# Patient Record
Sex: Male | Born: 1985 | Race: Black or African American | Hispanic: No | Marital: Single | Smoking: Current every day smoker
Health system: Southern US, Community
[De-identification: ages and names within clinical notes are randomized; demographics above are authoritative.]

## PROBLEM LIST (undated history)

## (undated) DIAGNOSIS — J45909 Unspecified asthma, uncomplicated: Secondary | ICD-10-CM

## (undated) DIAGNOSIS — J40 Bronchitis, not specified as acute or chronic: Secondary | ICD-10-CM

---
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
## 2010-05-13 ENCOUNTER — Emergency Department (HOSPITAL_COMMUNITY): Payer: Self-pay

## 2010-05-13 ENCOUNTER — Emergency Department (HOSPITAL_COMMUNITY)
Admission: EM | Admit: 2010-05-13 | Discharge: 2010-05-13 | Disposition: A | Discharge status: Home / Self Care | Payer: Self-pay | Attending: Emergency Medicine | Admitting: Emergency Medicine

## 2010-05-13 DIAGNOSIS — R071 Chest pain on breathing: Secondary | ICD-10-CM | POA: Insufficient documentation

## 2010-05-13 DIAGNOSIS — R079 Chest pain, unspecified: Secondary | ICD-10-CM | POA: Insufficient documentation

## 2015-04-16 ENCOUNTER — Emergency Department (HOSPITAL_BASED_OUTPATIENT_CLINIC_OR_DEPARTMENT_OTHER): Payer: Self-pay

## 2015-04-16 ENCOUNTER — Emergency Department (HOSPITAL_BASED_OUTPATIENT_CLINIC_OR_DEPARTMENT_OTHER)
Admission: EM | Admit: 2015-04-16 | Discharge: 2015-04-16 | Disposition: A | Discharge status: Home / Self Care | Payer: Self-pay | Attending: Emergency Medicine | Admitting: Emergency Medicine

## 2015-04-16 ENCOUNTER — Encounter (HOSPITAL_BASED_OUTPATIENT_CLINIC_OR_DEPARTMENT_OTHER): Payer: Self-pay

## 2015-04-16 DIAGNOSIS — J3489 Other specified disorders of nose and nasal sinuses: Secondary | ICD-10-CM | POA: Insufficient documentation

## 2015-04-16 DIAGNOSIS — J209 Acute bronchitis, unspecified: Secondary | ICD-10-CM

## 2015-04-16 DIAGNOSIS — R0602 Shortness of breath: Secondary | ICD-10-CM | POA: Insufficient documentation

## 2015-04-16 DIAGNOSIS — Z720 Tobacco use: Secondary | ICD-10-CM

## 2015-04-16 DIAGNOSIS — F17200 Nicotine dependence, unspecified, uncomplicated: Secondary | ICD-10-CM | POA: Insufficient documentation

## 2015-04-16 LAB — CBC
HEMATOCRIT: 42.7 % (ref 39.0–52.0)
HEMOGLOBIN: 14.1 g/dL (ref 13.0–17.0)
MCH: 27.2 pg (ref 26.0–34.0)
MCHC: 33 g/dL (ref 30.0–36.0)
MCV: 82.3 fL (ref 78.0–100.0)
Platelets: 211 10*3/uL (ref 150–400)
RBC: 5.19 MIL/uL (ref 4.22–5.81)
RDW: 14 % (ref 11.5–15.5)
WBC: 4.4 10*3/uL (ref 4.0–10.5)

## 2015-04-16 LAB — BASIC METABOLIC PANEL
ANION GAP: 7 (ref 5–15)
BUN: 20 mg/dL (ref 6–20)
CALCIUM: 9.2 mg/dL (ref 8.9–10.3)
CO2: 27 mmol/L (ref 22–32)
Chloride: 106 mmol/L (ref 101–111)
Creatinine, Ser: 1.01 mg/dL (ref 0.61–1.24)
GFR calc Af Amer: >60 mL/min (ref >60)
GLUCOSE: 97 mg/dL (ref 65–99)
POTASSIUM: 4.5 mmol/L (ref 3.5–5.1)
SODIUM: 140 mmol/L (ref 135–145)

## 2015-04-16 LAB — TROPONIN I

## 2015-04-16 MED ORDER — PREDNISONE 20 MG PO TABS
40.0000 mg | ORAL_TABLET | Freq: Once | ORAL | Status: AC
  Administered 2015-04-16: 40 mg via ORAL
  Filled 2015-04-16: qty 2

## 2015-04-16 MED ORDER — ALBUTEROL SULFATE HFA 108 (90 BASE) MCG/ACT IN AERS
2.0000 | INHALATION_SPRAY | Freq: Once | RESPIRATORY_TRACT | Status: AC
  Administered 2015-04-16: 2 via RESPIRATORY_TRACT
  Filled 2015-04-16: qty 6.7

## 2015-04-16 MED ORDER — ALBUTEROL SULFATE (2.5 MG/3ML) 0.083% IN NEBU
5.0000 mg | INHALATION_SOLUTION | Freq: Once | RESPIRATORY_TRACT | Status: AC
  Administered 2015-04-16: 5 mg via RESPIRATORY_TRACT
  Filled 2015-04-16: qty 6

## 2015-04-16 MED ORDER — PREDNISONE 20 MG PO TABS: 40.0000 mg | ORAL_TABLET | Freq: Every day | ORAL | Status: DC

## 2015-04-16 MED FILL — predniSONE 20 MG TABS: 20 | 5 days supply | Qty: 10 | Fill #0

## 2015-04-16 NOTE — Discharge Instructions (Signed)
You can use the inhaler you were given in the emergency department as follows: Take 2 puffs every 4-6 hours for shortness of breath.  Do not hesitate to return to the emergency room for any new, worsening or concerning symptoms.  Please obtain primary care using resource guide below. Let them know that you were seen in the emergency room and that they will need to obtain records for further outpatient management.   Acute Bronchitis Bronchitis is inflammation of the airways that extend from the windpipe into the lungs (bronchi). The inflammation often causes mucus to develop. This leads to a cough, which is the most common symptom of bronchitis.  In acute bronchitis, the condition usually develops suddenly and goes away over time, usually in a couple weeks. Smoking, allergies, and asthma can make bronchitis worse. Repeated episodes of bronchitis may cause further lung problems.  CAUSES Acute bronchitis is most often caused by the same virus that causes a cold. The virus can spread from person to person (contagious) through coughing, sneezing, and touching contaminated objects. SIGNS AND SYMPTOMS   Cough.   Fever.   Coughing up mucus.   Body aches.   Chest congestion.   Chills.   Shortness of breath.   Sore throat.  DIAGNOSIS  Acute bronchitis is usually diagnosed through a physical exam. Your health care provider will also ask you questions about your medical history. Tests, such as chest X-rays, are sometimes done to rule out other conditions.  TREATMENT  Acute bronchitis usually goes away in a couple weeks. Oftentimes, no medical treatment is necessary. Medicines are sometimes given for relief of fever or cough. Antibiotic medicines are usually not needed but may be prescribed in certain situations. In some cases, an inhaler may be recommended to help reduce shortness of breath and control the cough. A cool mist vaporizer may also be used to help thin bronchial secretions and  make it easier to clear the chest.  HOME CARE INSTRUCTIONS  Get plenty of rest.   Drink enough fluids to keep your urine clear or pale yellow (unless you have a medical condition that requires fluid restriction). Increasing fluids may help thin your respiratory secretions (sputum) and reduce chest congestion, and it will prevent dehydration.   Take medicines only as directed by your health care provider.  If you were prescribed an antibiotic medicine, finish it all even if you start to feel better.  Avoid smoking and secondhand smoke. Exposure to cigarette smoke or irritating chemicals will make bronchitis worse. If you are a smoker, consider using nicotine gum or skin patches to help control withdrawal symptoms. Quitting smoking will help your lungs heal faster.   Reduce the chances of another bout of acute bronchitis by washing your hands frequently, avoiding people with cold symptoms, and trying not to touch your hands to your mouth, nose, or eyes.   Keep all follow-up visits as directed by your health care provider.  SEEK MEDICAL CARE IF: Your symptoms do not improve after 1 week of treatment.  SEEK IMMEDIATE MEDICAL CARE IF:  You develop an increased fever or chills.   You have chest pain.   You have severe shortness of breath.  You have bloody sputum.   You develop dehydration.  You faint or repeatedly feel like you are going to pass out.  You develop repeated vomiting.  You develop a severe headache. MAKE SURE YOU:   Understand these instructions.  Will watch your condition.  Will get help right away if you  are not doing well or get worse.   This information is not intended to replace advice given to you by your health care provider. Make sure you discuss any questions you have with your health care provider.   Document Released: 04/15/2004 Document Revised: 03/29/2014 Document Reviewed: 08/29/2012 Elsevier Interactive Patient Education 2016 [employer].  Smoking Cessation, Tips for Success If you are ready to quit smoking, congratulations! You have chosen to help yourself be healthier. Cigarettes bring nicotine, tar, carbon monoxide, and other irritants into your body. Your lungs, heart, and blood vessels will be able to work better without these poisons. There are many different ways to quit smoking. Nicotine gum, nicotine patches, a nicotine inhaler, or nicotine nasal spray can help with physical craving. Hypnosis, support groups, and medicines help break the habit of smoking. WHAT THINGS CAN I DO TO MAKE QUITTING EASIER?  Here are some tips to help you quit for good:  Pick a date when you will quit smoking completely. Tell all of your friends and family about your plan to quit on that date.  Do not try to slowly cut down on the number of cigarettes you are smoking. Pick a quit date and quit smoking completely starting on that day.  Throw away all cigarettes.   Clean and remove all ashtrays from your home, work, and car.  On a card, write down your reasons for quitting. Carry the card with you and read it when you get the urge to smoke.  Cleanse your body of nicotine. Drink enough water and fluids to keep your urine clear or pale yellow. Do this after quitting to flush the nicotine from your body.  Learn to predict your moods. Do not let a bad situation be your excuse to have a cigarette. Some situations in your life might tempt you into wanting a cigarette.  Never have "just one" cigarette. It leads to wanting another and another. Remind yourself of your decision to quit.  Change habits associated with smoking. If you smoked while driving or when feeling stressed, try other activities to replace smoking. Stand up when drinking your coffee. Brush your teeth after eating. Sit in a different chair when you read the paper. Avoid alcohol while trying to quit, and try to drink fewer caffeinated beverages. Alcohol and caffeine may urge you  to smoke.  Avoid foods and drinks that can trigger a desire to smoke, such as sugary or spicy foods and alcohol.  Ask people who smoke not to smoke around you.  Have something planned to do right after eating or having a cup of coffee. For example, plan to take a walk or exercise.  Try a relaxation exercise to calm you down and decrease your stress. Remember, you may be tense and nervous for the first 2 weeks after you quit, but this will pass.  Find new activities to keep your hands busy. Play with a pen, coin, or rubber band. Doodle or draw things on paper.  Brush your teeth right after eating. This will help cut down on the craving for the taste of tobacco after meals. You can also try mouthwash.   Use oral substitutes in place of cigarettes. Try using lemon drops, carrots, cinnamon sticks, or chewing gum. Keep them handy so they are available when you have the urge to smoke.  When you have the urge to smoke, try deep breathing.  Designate your home as a nonsmoking area.  If you are a heavy smoker, ask your health  care provider about a prescription for nicotine chewing gum. It can ease your withdrawal from nicotine.  Reward yourself. Set aside the cigarette money you save and buy yourself something nice.  Look for support from others. Join a support group or smoking cessation program. Ask someone at home or at work to help you with your plan to quit smoking.  Always ask yourself, "Do I need this cigarette or is this just a reflex?" Tell yourself, "Today, I choose not to smoke," or "I do not want to smoke." You are reminding yourself of your decision to quit.  Do not replace cigarette smoking with electronic cigarettes (commonly called e-cigarettes). The safety of e-cigarettes is unknown, and some may contain harmful chemicals.  If you relapse, do not give up! Plan ahead and think about what you will do the next time you get the urge to smoke. HOW WILL I FEEL WHEN I QUIT  SMOKING? You may have symptoms of withdrawal because your body is used to nicotine (the addictive substance in cigarettes). You may crave cigarettes, be irritable, feel very hungry, cough often, get headaches, or have difficulty concentrating. The withdrawal symptoms are only temporary. They are strongest when you first quit but will go away within 10-14 days. When withdrawal symptoms occur, stay in control. Think about your reasons for quitting. Remind yourself that these are signs that your body is healing and getting used to being without cigarettes. Remember that withdrawal symptoms are easier to treat than the major diseases that smoking can cause.  Even after the withdrawal is over, expect periodic urges to smoke. However, these cravings are generally short lived and will go away whether you smoke or not. Do not smoke! WHAT RESOURCES ARE AVAILABLE TO HELP ME QUIT SMOKING? Your health care provider can direct you to community resources or hospitals for support, which may include:  Group support.  Education.  Hypnosis.  Therapy.   This information is not intended to replace advice given to you by your health care provider. Make sure you discuss any questions you have with your health care provider.   Document Released: 12/05/2003 Document Revised: 03/29/2014 Document Reviewed: 08/24/2012 Elsevier Interactive Patient Education 2016 [employer].    Emergency Department Resource Guide 1) Find a Doctor and Pay Out of Pocket Although you won't have to find out who is covered by your insurance plan, it is a good idea to ask around and get recommendations. You will then need to call the office and see if the doctor you have chosen will accept you as a new patient and what types of options they offer for patients who are self-pay. Some doctors offer discounts or will set up payment plans for their patients who do not have insurance, but you will need to ask so you aren't surprised when you get  to your appointment.  2) Contact Your Local Health Department Not all health departments have doctors that can see patients for sick visits, but many do, so it is worth a call to see if yours does. If you don't know where your local health department is, you can check in your phone book. The CDC also has a tool to help you locate your state's health department, and many state websites also have listings of all of their local health departments.  3) Find a Walk-in Clinic If your illness is not likely to be very severe or complicated, you may want to try a walk in clinic. These are popping up all over the  country in pharmacies, drugstores, and shopping centers. They're usually staffed by nurse practitioners or physician assistants that have been trained to treat common illnesses and complaints. They're usually fairly quick and inexpensive. However, if you have serious medical issues or chronic medical problems, these are probably not your best option.  No Primary Care Doctor: - Call Health Connect at  [phone] - they can help you locate a primary care doctor that  accepts your insurance, provides certain services, etc. - Physician Referral Service- [phone]  Chronic Pain Problems: Organization         Address  Phone   Notes  [facility]  [phone] Patients need to be referred by their primary care doctor.   Medication Assistance: Organization         Address  Phone   Notes  [facility] Medication [facility] [address] [facility] [address] [place] [place], [place] [address] [phone] --Must be a resident of [facility] -- Must have NO insurance coverage whatsoever (no Medicaid/ Medicare, etc.) -- The pt. MUST have a primary care doctor that directs their care regularly and follows them in the community   [facility]  [phone]   [employer]  [phone]    Agencies that provide inexpensive medical care: Organization         Address  Phone    Notes  [facility]  [phone]   [facility]    [phone]   [facility] [address] [place], [place] [address] [phone]   [facility] [address], [place] [phone]   Planned Parenthood    [phone]   [facility]    [phone]   [facility]  [address], [place] Phone:  [phone], Fax:  [phone] Hours of Operation:  9 am - 6 pm, M-F.  Also accepts Medicaid/Medicare and self-pay.  [facility]  [address], [place] Phone: [phone], Fax: [phone]. Hours of Operation:  8:30 am - 5:30 pm, M-F.  Also accepts Medicaid and self-pay.  [facility] [place] [address] [place] Phone: [phone]   [facility] [address] Natasha Bence [place], [place] [phone], Ext. [phone] Mondays & Thursdays: 7-9 AM.  First 15 patients are seen on a first come, first serve basis.    Medicaid-accepting [facility] Providers:  Organization         Address  Phone   Notes  [facility] [address], [place] [phone] Also accepts self-pay patients.  [facility] [address] Laurell Josephs [place], [place]  [phone]   [facility] [address], [place] [phone]   [facility] [address], [place] [phone]   Renaye Rakers [address], [place]   [phone] Only accepts [place] Access [place] patients after they have their name applied to their card.   Self-Pay (no insurance) in [facility]:  Organization         Address  Phone   Notes  Sickle Cell Patients, [facility] [address] [place], [place] [phone]   [facility] [address] [place], [place] [phone]   [facility] [place]  [address]  [address], [place] [phone]   [facility]/Dr. Osei-Bonsu  [address], [place] or [address], [place] [phone] Phone number for both [place] and [place] locations is the same.  [facility] [address] [place] [phone]   [facility] [address], [place] or [address] [phone]   [facility] [address] [place] [phone], phone; [phone], fax Sees patients 1st and 3rd Saturday of every month.  Must not qualify for public or private insurance (i.e. Medicaid, Medicare, [place] Health Choice, Veterans' Benefits)  Household income should be no more than 200% of the poverty level The clinic cannot treat you if you are pregnant or think you are pregnant  Sexually transmitted diseases are not treated at the clinic.    Dental Care: Organization         Address  Phone  Notes  [facility] Department of [facility] [address] [place], [place] [phone] Accepts children up to age 46 who are enrolled in [place] or [place] Health Choice; pregnant women with a Medicaid card; and children who have applied for Medicaid or [place] Health Choice, but were declined, whose parents can pay a reduced fee at time of service.  [facility] Department of [facility]  [address], [place] [phone] Accepts children up to age 39 who are enrolled in [place] or [place] Health Choice; pregnant women with a Medicaid card; and children who have applied for Medicaid or [place] Health Choice, but were declined, whose parents can pay a reduced fee at time of service.  [facility]  [address] [place], [place] [phone] Patients are seen by appointment only. Walk-ins are not accepted. [facility] will see patients 81 years of age  and older. Monday - Tuesday (8am-5pm) Most Wednesdays (8:30-5pm) $30 per visit, cash only  [facility] Adult [facility] Access [facility]  [address], [place] [phone] Patients are seen by appointment only. Walk-ins are not accepted. [facility] will see patients 51 years of age and older. One Wednesday Evening (Monthly: Volunteer Based).  $30 per visit, cash only  [employer]  [phone] for adults; Children under age 2, call Graduate Pediatric Dentistry at [phone]. Children aged 82-14, please call [phone] to request a pediatric application.  Dental services are provided in all areas of dental care including fillings, crowns and bridges, complete and partial dentures, implants, gum treatment, root canals, and extractions. Preventive care is also provided. Treatment is provided to both adults and children. Patients are selected via a lottery and there is often a waiting list.   [facility] [address] [place]  [phone] [email or web address]   [facility] [address] [place], [place] [phone], Ext. [phone] Second and Fourth Thursday of each month, opens at 6:30 AM; Clinic ends at 9 AM.  Patients are seen on a first-come first-served basis, and a limited number are seen during each clinic.   [facility]  [address] Ether Griffins [place], [place] [phone]   Eligibility Requirements You must have lived in [place], [place], or [place] counties for at least the last three months.   You cannot be eligible for state or federal sponsored [place], including [employer], [place], or [employer].   You generally  cannot be eligible for healthcare insurance through your employer.    How to apply: Eligibility screenings are held every Tuesday and Wednesday afternoon from 1:00 pm until 4:00 pm. You do not need an appointment for the interview!  [facility] [address], [place], [place] [phone]   [facility] Health Department  [phone]   [facility] Health Department  [phone]   [facility] Health Department  [phone]    Behavioral Health Resources in the Community: Intensive Outpatient Programs Organization         Address  Phone  Notes  [facility] Services [address] [place], [place] [phone]   [facility] Outpatient [address] [place], [place] [phone]   [facility] [address] [place], [place]  [phone]   [facility] [address],  [place], [place] [phone] or [phone]   Substance Abuse Resources Organization         Address  Phone  Notes  [facility]  [phone]   [facility]  [phone]   [employer]  [phone]   Floydene Flock  [phone]   Residential & Outpatient Substance Abuse Program  [phone]   Psychological Services Organization         Address  Phone  Notes  [facility] Behavioral Health  [phone]   [facility]  [phone]   [facility] [address], [place] [phone] or [phone]    Mobile Crisis Teams Organization         Address  Phone  Notes  [facility], Mobile Crisis Care Unit  [phone]   [facility]  [address] [place], [place] [phone]   Doristine Locks [address] [place] [phone]    Self-Help/Support Groups Organization         Address  Phone             Notes  [facility] [place] - variety of support groups  [phone]- [phone] Call for more information  Narcotics Anonymous (NA), [facility] [address], [employer] [place]  2 meetings at this location   Statistician         Address  Phone  Notes  [facility] [address],    [place]  [phone]   [facility]  [address], [place] [address], [place], [place] [phone]   [facility] Treatment Facility [address] [place], [place] [phone] Admissions: 8am-3pm M-F  [facility] [address],    [place], [place] [phone]   [facility] [address] [place], [place], [place] [phone]   [facility] [address]  [place], [place] [phone]   [facility] - Intensive Outpatient [address] Laurell Josephs [address], [place], [place] [phone]   [facility] ([facility]iction [facility] [address] [place].,  [place], [place] [phone] or [phone]   [facility] ([facility]) [address], [place], [place] [phone] Accepts Medicaid  [facility] [place] [address]  [place] [phone] Substance Abuse/Addiction Treatment   [facility] Organization         Address  Phone  Notes  [facility]  [phone]   Angie Fava, PhD [address] [place], [place]   [phone] or [phone]   [facility]   [address]  [address] [place], [place] [phone]) [phone]   [facility] [address], [place], [place] [phone]) [phone] Insurance/Medicaid/sponsorship through [facility]  [facility] [address]                                    [place], [place] [phone](336) [phone] Therapy/tele-psych/case  [facility] [address]   [place], [place] [phone]) [phone]    Dr. Lolly MustacheArfeen  [phone]) [phone]   [facility] [place]  [employer] [facility] 1) [address], [place] 2) [address], [place] 3)  [address], [place] [phone]) [phone]) [phone]  [phone](336) [phone]   [facility] Child Abuse Hotline [phone]) [phone] or [phone](336) [phone] (After Hours)

## 2015-04-16 NOTE — ED Provider Notes (Signed)
CSN: [id number]     Arrival date & time 04/16/15  1138 History   First MD Initiated Contact with Patient 04/16/15 1205     Chief Complaint  Patient presents with  . Chest Pain     (Consider location/radiation/quality/duration/timing/severity/associated sxs/prior Treatment) HPI   Blood pressure 150/93, pulse 71, temperature 98.4 F (36.9 C), temperature source Oral, resp. rate 16, height  (1.702 m), weight 63.504 kg, SpO2 100 %.  Trevor Gonzalez is a 30 y.o. male complaining of diffuse chest tightness with associated shortness of breath and rhinorrhea onset 4 days ago no medication taken prior to arrival. Patient denies actual pain, cough, fever, chills, palpitations, lightheadedness, family history of early cardiac death, cocaine or methamphetamine use, diabetes, hypertension, hyperlipidemia, history of DVT or PE, recent mobilizations, calf pain or leg swelling, cancer, chemotherapy. On review of systems, patient endorses daily tobacco use.  History reviewed. No pertinent past medical history. History reviewed. No pertinent past surgical history. No family history on file. Social History  Substance Use Topics  . Smoking status: Current Every Day Smoker  . Smokeless tobacco: None  . Alcohol Use: Yes     Comment: occ    Review of Systems  10 systems reviewed and found to be negative, except as noted in the HPI.  Allergies  Review of patient's allergies indicates no known allergies.  Home Medications   Prior to Admission medications   Medication Sig Start Date End Date Taking? Authorizing Provider  predniSONE (DELTASONE) 20 MG tablet Take 2 tablets (40 mg total) by mouth daily. 04/16/15   Dante Cooter, PA-C   BP 125/75 mmHg  Pulse 68  Temp(Src) 98.4 F (36.9 C) (Oral)  Resp 19  Ht  (1.702 m)  Wt 63.504 kg  BMI 21.92 kg/m2  SpO2 96% Physical Exam  Constitutional: He is oriented to person, place, and time. He appears well-developed and well-nourished. No  distress.  HENT:  Head: Normocephalic.  Mouth/Throat: Oropharynx is clear and moist.  Eyes: Conjunctivae are normal.  Neck: Normal range of motion. No JVD present. No tracheal deviation present.  Cardiovascular: Normal rate, regular rhythm and intact distal pulses.   Radial pulse equal bilaterally  Pulmonary/Chest: Effort normal and breath sounds normal. No stridor. No respiratory distress. He has no wheezes. He has no rales. He exhibits no tenderness.  Abdominal: Soft. He exhibits no distension and no mass. There is no tenderness. There is no rebound and no guarding.  Musculoskeletal: Normal range of motion. He exhibits no edema or tenderness.  No calf asymmetry, superficial collaterals, palpable cords, edema, Homans sign negative bilaterally.    Neurological: He is alert and oriented to person, place, and time.  Skin: Skin is warm. He is not diaphoretic.  Psychiatric: He has a normal mood and affect.  Nursing note and vitals reviewed.   ED Course  Procedures (including critical care time) Labs Review Labs Reviewed  BASIC METABOLIC PANEL  CBC  TROPONIN I    Imaging Review Dg Chest 2 View  04/16/2015  CLINICAL DATA:  Shortness of breath, left-sided chest tightness for 4 days EXAM: CHEST  2 VIEW COMPARISON:  05/13/2010 FINDINGS: The heart size and mediastinal contours are within normal limits. Both lungs are clear. The visualized skeletal structures are unremarkable. IMPRESSION: No active cardiopulmonary disease. Electronically Signed   By: Elige Ko   On: 04/16/2015 12:46   I have personally reviewed and evaluated these images and lab results as part of my medical decision-making.  EKG Interpretation   Date/Time:  Wednesday April 16 2015 11:47:55 EST Ventricular Rate:  69 PR Interval:  128 QRS Duration: 84 QT Interval:  389 QTC Calculation: 417 R Axis:   60 Text Interpretation:  Sinus rhythm ST elev, probable normal early repol  pattern Baseline wander in lead(s) I  II III aVR aVF No previous tracing  Confirmed by Anitra Lauth  MD, WHITNEY ([id number]) on 04/16/2015 11:59:47 AM      MDM   Final diagnoses:  Acute bronchitis, unspecified organism  Tobacco use    Filed Vitals:   04/16/15 1230 04/16/15 1243 04/16/15 1245 04/16/15 1300  BP: 140/88   125/75  Pulse: 66  66 68  Temp:      TempSrc:      Resp: Height:      Weight:      SpO2: 100% 100% 100% 96%    Medications  predniSONE (DELTASONE) tablet 40 mg (40 mg Oral Given 04/16/15 1232)  albuterol (PROVENTIL) (2.5 MG/3ML) 0.083% nebulizer solution 5 mg (5 mg Nebulization Given 04/16/15 1242)  albuterol (PROVENTIL HFA;VENTOLIN HFA) 108 (90 Base) MCG/ACT inhaler 2 puff (2 puffs Inhalation Given 04/16/15 1242)    Trevor Gonzalez is 30 y.o. male presenting with chest tightness, shortness of breath and rhinorrhea. EKG nonischemic, lung sounds clear to auscultation bilaterally, patient saturating well on room air, there is no tachypnea or tachycardia. His blood pressure is mildly elevated. I've counseled this patient for greater than 10 minutes about smoking cessation. Will initiate generalized cardiac workup, likely a acute bronchitis. Patient will be given prednisone and nebulizer treatment.  Chest x-ray clear, blood work without abnormality. Low risk by HEART store and PERC negative. patient with improvement after nebulizer, again stressed importance of smoking cessation.   Evaluation does not show pathology that would require ongoing emergent intervention or inpatient treatment. Pt is hemodynamically stable and mentating appropriately. Discussed findings and plan with patient/guardian, who agrees with care plan. All questions answered. Return precautions discussed and outpatient follow up given.   New Prescriptions   PREDNISONE (DELTASONE) 20 MG TABLET    Take 2 tablets (40 mg total) by mouth daily.         Wynetta Emery, PA-C 04/16/15 1309  Gwyneth Sprout, MD 04/16/15 1432

## 2015-04-16 NOTE — ED Notes (Signed)
Patient transported to X-ray 

## 2015-04-16 NOTE — ED Notes (Signed)
CP x 3-4 days-NAD-steady gait

## 2015-06-18 ENCOUNTER — Emergency Department (HOSPITAL_BASED_OUTPATIENT_CLINIC_OR_DEPARTMENT_OTHER): Payer: Worker's Compensation

## 2015-06-18 ENCOUNTER — Encounter (HOSPITAL_BASED_OUTPATIENT_CLINIC_OR_DEPARTMENT_OTHER): Payer: Self-pay | Admitting: Emergency Medicine

## 2015-06-18 DIAGNOSIS — S61012A Laceration without foreign body of left thumb without damage to nail, initial encounter: Secondary | ICD-10-CM | POA: Diagnosis not present

## 2015-06-18 DIAGNOSIS — F17200 Nicotine dependence, unspecified, uncomplicated: Secondary | ICD-10-CM | POA: Diagnosis not present

## 2015-06-18 DIAGNOSIS — W231XXA Caught, crushed, jammed, or pinched between stationary objects, initial encounter: Secondary | ICD-10-CM | POA: Insufficient documentation

## 2015-06-18 DIAGNOSIS — Y9289 Other specified places as the place of occurrence of the external cause: Secondary | ICD-10-CM | POA: Diagnosis not present

## 2015-06-18 DIAGNOSIS — Z7952 Long term (current) use of systemic steroids: Secondary | ICD-10-CM | POA: Insufficient documentation

## 2015-06-18 DIAGNOSIS — Y990 Civilian activity done for income or pay: Secondary | ICD-10-CM | POA: Insufficient documentation

## 2015-06-18 DIAGNOSIS — Y9389 Activity, other specified: Secondary | ICD-10-CM | POA: Diagnosis not present

## 2015-06-18 DIAGNOSIS — S6992XA Unspecified injury of left wrist, hand and finger(s), initial encounter: Secondary | ICD-10-CM | POA: Diagnosis present

## 2015-06-18 NOTE — ED Notes (Signed)
Patient states that he hit his left thumb at work.

## 2015-06-19 ENCOUNTER — Emergency Department (HOSPITAL_BASED_OUTPATIENT_CLINIC_OR_DEPARTMENT_OTHER)
Admission: EM | Admit: 2015-06-19 | Discharge: 2015-06-19 | Disposition: A | Discharge status: Home / Self Care | Payer: Worker's Compensation | Attending: Emergency Medicine | Admitting: Emergency Medicine

## 2015-06-19 DIAGNOSIS — IMO0002 Reserved for concepts with insufficient information to code with codable children: Secondary | ICD-10-CM

## 2015-06-19 MED ORDER — LIDOCAINE HCL (PF) 2 % IJ SOLN
INTRAMUSCULAR | Status: AC
  Filled 2015-06-19: qty 4

## 2015-06-19 MED ORDER — BACITRACIN-NEOMYCIN-POLYMYXIN OINTMENT TUBE
TOPICAL_OINTMENT | Freq: Once | CUTANEOUS | Status: AC
  Administered 2015-06-19: 02:00:00 via TOPICAL
  Filled 2015-06-19: qty 15

## 2015-06-19 MED ORDER — LIDOCAINE HCL 2 % IJ SOLN: 10.0000 mL | Freq: Once | INTRAMUSCULAR | Status: DC

## 2015-06-19 NOTE — Discharge Instructions (Signed)
Return to ER, going to urgent care, or see your doctor in 10 days for suture removal  Laceration Care, Adult A laceration is a cut that goes through all of the layers of the skin and into the tissue that is right under the skin. Some lacerations heal on their own. Others need to be closed with stitches (sutures), staples, skin adhesive strips, or skin glue. Proper laceration care minimizes the risk of infection and helps the laceration to heal better. HOW TO CARE FOR YOUR LACERATION If sutures or staples were used:  Keep the wound clean and dry.  If you were given a bandage (dressing), you should change it at least one time per day or as told by your health care provider. You should also change it if it becomes wet or dirty.  Keep the wound completely dry for the first 24 hours or as told by your health care provider. After that time, you may shower or bathe. However, make sure that the wound is not soaked in water until after the sutures or staples have been removed.  Clean the wound one time each day or as told by your health care provider:  Wash the wound with soap and water.  Rinse the wound with water to remove all soap.  Pat the wound dry with a clean towel. Do not rub the wound.  After cleaning the wound, apply a thin layer of antibiotic ointmentas told by your health care provider. This will help to prevent infection and keep the dressing from sticking to the wound.  Have the sutures or staples removed as told by your health care provider. If skin adhesive strips were used:  Keep the wound clean and dry.  If you were given a bandage (dressing), you should change it at least one time per day or as told by your health care provider. You should also change it if it becomes dirty or wet.  Do not get the skin adhesive strips wet. You may shower or bathe, but be careful to keep the wound dry.  If the wound gets wet, pat it dry with a clean towel. Do not rub the wound.  Skin  adhesive strips fall off on their own. You may trim the strips as the wound heals. Do not remove skin adhesive strips that are still stuck to the wound. They will fall off in time. If skin glue was used:  Try to keep the wound dry, but you may briefly wet it in the shower or bath. Do not soak the wound in water, such as by swimming.  After you have showered or bathed, gently pat the wound dry with a clean towel. Do not rub the wound.  Do not do any activities that will make you sweat heavily until the skin glue has fallen off on its own.  Do not apply liquid, cream, or ointment medicine to the wound while the skin glue is in place. Using those may loosen the film before the wound has healed.  If you were given a bandage (dressing), you should change it at least one time per day or as told by your health care provider. You should also change it if it becomes dirty or wet.  If a dressing is placed over the wound, be careful not to apply tape directly over the skin glue. Doing that may cause the glue to be pulled off before the wound has healed.  Do not pick at the glue. The skin glue usually remains in  place for 5-10 days, then it falls off of the skin. General Instructions  Take over-the-counter and prescription medicines only as told by your health care provider.  If you were prescribed an antibiotic medicine or ointment, take or apply it as told by your doctor. Do not stop using it even if your condition improves.  To help prevent scarring, make sure to cover your wound with sunscreen whenever you are outside after stitches are removed, after adhesive strips are removed, or when glue remains in place and the wound is healed. Make sure to wear a sunscreen of at least 30 SPF.  Do not scratch or pick at the wound.  Keep all follow-up visits as told by your health care provider. This is important.  Check your wound every day for signs of infection. Watch for:  Redness, swelling, or  pain.  Fluid, blood, or pus.  Raise (elevate) the injured area above the level of your heart while you are sitting or lying down, if possible. SEEK MEDICAL CARE IF:  You received a tetanus shot and you have swelling, severe pain, redness, or bleeding at the injection site.  You have a fever.  A wound that was closed breaks open.  You notice a bad smell coming from your wound or your dressing.  You notice something coming out of the wound, such as wood or glass.  Your pain is not controlled with medicine.  You have increased redness, swelling, or pain at the site of your wound.  You have fluid, blood, or pus coming from your wound.  You notice a change in the color of your skin near your wound.  You need to change the dressing frequently due to fluid, blood, or pus draining from the wound.  You develop a new rash.  You develop numbness around the wound. SEEK IMMEDIATE MEDICAL CARE IF:  You develop severe swelling around the wound.  Your pain suddenly increases and is severe.  You develop painful lumps near the wound or on skin that is anywhere on your body.  You have a red streak going away from your wound.  The wound is on your hand or foot and you cannot properly move a finger or toe.  The wound is on your hand or foot and you notice that your fingers or toes look pale or bluish.   This information is not intended to replace advice given to you by your health care provider. Make sure you discuss any questions you have with your health care provider.   Document Released: 03/08/2005 Document Revised: 07/23/2014 Document Reviewed: 03/04/2014 Elsevier Interactive Patient Education [employer]2016 [employer]

## 2015-06-19 NOTE — ED Provider Notes (Signed)
CSN: [id number]     Arrival date & time 06/18/15  2253 History   First MD Initiated Contact with Patient 06/19/15 [phone]     Chief Complaint  Patient presents with  . Finger Injury     (Consider location/radiation/quality/duration/timing/severity/associated sxs/prior Treatment) HPI Comments: Injury to left thumb while at work several hours ago. Complains of laceration an mild pain over knuckle.    History reviewed. No pertinent past medical history. History reviewed. No pertinent past surgical history. History reviewed. No pertinent family history. Social History  Substance Use Topics  . Smoking status: Current Every Day Smoker  . Smokeless tobacco: None  . Alcohol Use: Yes     Comment: occ    Review of Systems  Skin: Positive for wound.      Allergies  Review of patient's allergies indicates no known allergies.  Home Medications   Prior to Admission medications   Medication Sig Start Date End Date Taking? Authorizing Provider  predniSONE (DELTASONE) 20 MG tablet Take 2 tablets (40 mg total) by mouth daily. 04/16/15   Nicole Pisciotta, PA-C   BP 153/91 mmHg  Pulse 63  Temp(Src) 98.2 F (36.8 C) (Oral)  Resp 18  Ht 5\' 9"  (1.753 m)  Wt 140 lb (63.504 kg)  BMI 20.67 kg/m2  SpO2 100% Physical Exam  Musculoskeletal: Normal range of motion.       Left hand: He exhibits laceration. He exhibits normal range of motion, normal two-point discrimination, normal capillary refill and no deformity. Normal sensation noted. Decreased strength noted. He exhibits finger abduction and thumb/finger opposition.       Hands: Neurological: He has normal strength. No sensory deficit.  Skin: Laceration noted.    ED Course  Procedures (including critical care time)  LACERATION REPAIR Performed by: Gilda CreasePOLLINA, CHRISTOPHER J. Authorized by: Gilda CreasePOLLINA, CHRISTOPHER J. Consent: Verbal consent obtained. Risks and benefits: risks, benefits and alternatives were discussed Consent given by:  patient Patient identity confirmed: provided demographic data Prepped and Draped in normal sterile fashion Wound explored  Laceration Location: thumb  Laceration Length: 1cm  No Foreign Bodies seen or palpated  Anesthesia: digital block  Local anesthetic: lidocaine 2% without epinephrine  Anesthetic total: 6 ml  Irrigation method: syringe Amount of cleaning: standard  Skin closure: suture  Number of sutures: 2  Technique: simple interrupted, 4-0 prolene  Patient tolerance: Patient tolerated the procedure well with no immediate complications.   Labs Review Labs Reviewed - No data to display  Imaging Review Dg Hand Complete Left  06/19/2015  CLINICAL DATA:  30 year old male with left thumb injury. EXAM: LEFT HAND - COMPLETE 3+ VIEW COMPARISON:  None. FINDINGS: There is no evidence of fracture or dislocation. There is no evidence of arthropathy or other focal bone abnormality. Soft tissues are unremarkable. IMPRESSION: Negative. Electronically Signed   By: Elgie CollardArash  Radparvar M.D.   On: 06/19/2015 00:27   I have personally reviewed and evaluated these images and lab results as part of my medical decision-making.   EKG Interpretation None      MDM   Final diagnoses:  None  Laceration  Patient presents to the emergency part for evaluation of laceration of left thumb. Patient reports that he cut his thumb caught between 2 pieces of machinery causing the cough. X-ray does not show any evidence of fracture or foreign body. Wound was sutured, suture removal in 10 days.    Gilda Creasehristopher J Pollina, MD 06/19/15 [id number]

## 2015-06-19 NOTE — ED Notes (Signed)
Wound care done

## 2015-06-19 NOTE — ED Notes (Signed)
Pt states he got his left thumb caught between two pieces of machinery. Approx 1/2 cm lac noted to left thumb. Bleeding controlled. Moves thumb. Feels touch. Cap refill < 3 sec.

## 2015-06-19 NOTE — ED Notes (Signed)
Pt given d/c instructions. Verbalizes understanding. No questions. 

## 2015-07-25 ENCOUNTER — Emergency Department (HOSPITAL_BASED_OUTPATIENT_CLINIC_OR_DEPARTMENT_OTHER)
Admission: EM | Admit: 2015-07-25 | Discharge: 2015-07-25 | Disposition: A | Discharge status: Home / Self Care | Payer: Self-pay | Attending: Emergency Medicine | Admitting: Emergency Medicine

## 2015-07-25 ENCOUNTER — Encounter (HOSPITAL_BASED_OUTPATIENT_CLINIC_OR_DEPARTMENT_OTHER): Payer: Self-pay | Admitting: *Deleted

## 2015-07-25 DIAGNOSIS — F17200 Nicotine dependence, unspecified, uncomplicated: Secondary | ICD-10-CM | POA: Insufficient documentation

## 2015-07-25 DIAGNOSIS — Z5189 Encounter for other specified aftercare: Secondary | ICD-10-CM

## 2015-07-25 DIAGNOSIS — Z4800 Encounter for change or removal of nonsurgical wound dressing: Secondary | ICD-10-CM | POA: Insufficient documentation

## 2015-07-25 NOTE — Discharge Instructions (Signed)
Your wound appears to be healing well this time. Watch for redness or creasing swelling. Watch for increased pain.

## 2015-07-25 NOTE — ED Provider Notes (Signed)
CSN: [id number]     Arrival date & time 07/25/15  1752 History  By signing my name below, I, Linus GalasMaharshi Patel, attest that this documentation has been prepared under the direction and in the presence of Benjiman CoreNathan Nana Vastine, MD. Electronically Signed: Linus GalasMaharshi Patel, ED Scribe. 07/25/2015. 6:08 PM.   Chief Complaint  Patient presents with  . Wound Check   The history is provided by the patient. No language interpreter was used.    HPI Comments: Trevor Gonzalez is a 30 y.o. male who presents to the Emergency Department with no PMHx complaining of for a wound check today. Pt states one month ago, he slammed his right thumb between a table and a machine. He had sutures taken out of his left thumb last week and notes swelling since. Pt denies any fevers or chills. Pt has no other complaint at this time.    History reviewed. No pertinent past medical history. History reviewed. No pertinent past surgical history. No family history on file. Social History  Substance Use Topics  . Smoking status: Current Every Day Smoker  . Smokeless tobacco: None  . Alcohol Use: Yes     Comment: occ    Review of Systems  Constitutional: Negative for fever and chills.  Skin: Positive for wound.   Allergies  Review of patient's allergies indicates no known allergies.  Home Medications   Prior to Admission medications   Medication Sig Start Date End Date Taking? Authorizing Provider  predniSONE (DELTASONE) 20 MG tablet Take 2 tablets (40 mg total) by mouth daily. 04/16/15   Nicole Pisciotta, PA-C   BP 145/100 mmHg  Pulse 70  Temp(Src) 98.2 F (36.8 C) (Oral)  Resp 16  Ht 5\' 7"  (1.702 m)  Wt 140 lb (63.504 kg)  BMI 21.92 kg/m2  SpO2 99%   Physical Exam  Constitutional: He is oriented to person, place, and time. He appears well-developed and well-nourished.  HENT:  Head: Normocephalic and atraumatic.  Cardiovascular: Normal rate.   Pulmonary/Chest: Effort normal.  Musculoskeletal:  Lateral aspect of  the left thumb, over the IP joint is a 1.5 cm darkened raised area with 4 smaller ulcers to the suture site, good flexion and extension, strength and sensation 5/5, no proximal lymphadenopathy, no fluctuance or erythema.    Neurological: He is alert and oriented to person, place, and time.  Skin: Skin is warm and dry.  Psychiatric: He has a normal mood and affect.  Nursing note and vitals reviewed.  ED Course  Procedures  DIAGNOSTIC STUDIES: Oxygen Saturation is 99% on room air, normal by my interpretation.    COORDINATION OF CARE: 6:02 PM Discussed treatment plan with pt at bedside and pt agreed to plan.  MDM   Final diagnoses:  Visit for wound check    Patient for recheck of wound on thumb. Removed after being in for rather long time. Wound does not appear infected this time. Good range of motion. Patient will do some warm soaks. Will discharge.  I personally performed the services described in this documentation, which was scribed in my presence. The recorded information has been reviewed and is accurate.       Benjiman CoreNathan Kaeden Depaz, MD 07/25/15 [phone]

## 2015-07-25 NOTE — ED Notes (Signed)
States he had sutures taken out of his left thumb last week and now he has swelling of his finger.

## 2017-06-13 ENCOUNTER — Other Ambulatory Visit: Payer: Self-pay

## 2017-06-13 ENCOUNTER — Emergency Department (HOSPITAL_BASED_OUTPATIENT_CLINIC_OR_DEPARTMENT_OTHER)
Admission: EM | Admit: 2017-06-13 | Discharge: 2017-06-13 | Disposition: A | Discharge status: Home / Self Care | Payer: Self-pay | Attending: Emergency Medicine | Admitting: Emergency Medicine

## 2017-06-13 ENCOUNTER — Encounter (HOSPITAL_BASED_OUTPATIENT_CLINIC_OR_DEPARTMENT_OTHER): Payer: Self-pay | Admitting: *Deleted

## 2017-06-13 DIAGNOSIS — Z202 Contact with and (suspected) exposure to infections with a predominantly sexual mode of transmission: Secondary | ICD-10-CM | POA: Insufficient documentation

## 2017-06-13 DIAGNOSIS — L02415 Cutaneous abscess of right lower limb: Secondary | ICD-10-CM | POA: Insufficient documentation

## 2017-06-13 DIAGNOSIS — L0291 Cutaneous abscess, unspecified: Secondary | ICD-10-CM

## 2017-06-13 DIAGNOSIS — N509 Disorder of male genital organs, unspecified: Secondary | ICD-10-CM | POA: Insufficient documentation

## 2017-06-13 LAB — URINALYSIS, ROUTINE W REFLEX MICROSCOPIC
Bilirubin Urine: NEGATIVE
Glucose, UA: NEGATIVE mg/dL
Hgb urine dipstick: NEGATIVE
KETONES UR: NEGATIVE mg/dL
LEUKOCYTES UA: NEGATIVE
NITRITE: NEGATIVE
PH: 7.5 (ref 5.0–8.0)
Protein, ur: NEGATIVE mg/dL
Specific Gravity, Urine: 1.01 (ref 1.005–1.030)

## 2017-06-13 MED ORDER — DOXYCYCLINE HYCLATE 100 MG PO CAPS: 100.0000 mg | ORAL_CAPSULE | Freq: Two times a day (BID) | ORAL | 0 refills | Status: DC

## 2017-06-13 MED ORDER — LIDOCAINE-EPINEPHRINE (PF) 2 %-1:200000 IJ SOLN
10.0000 mL | Freq: Once | INTRAMUSCULAR | Status: AC
  Administered 2017-06-13: 10 mL
  Filled 2017-06-13: qty 10

## 2017-06-13 MED FILL — DOXYCYCLINE HYCLATE 100 MG: 100 | 7 days supply | Qty: 14 | Fill #0

## 2017-06-13 NOTE — ED Triage Notes (Signed)
Penile discharge. Possible STD exposure. He also wants to have his right thigh examined. He was bit by a spider 3 days ago.

## 2017-06-13 NOTE — Discharge Instructions (Addendum)
He was seen in the emergency department today and found to have an abscess.  This was incised and drained.  A packing was placed in the abscess.  This may continue to drain over the next 24-48 hours.  Also prescribed you doxycycline, this is an antibiotic to take to help treat with the infection.  Take this once in the morning and once in the evening.  Take Motrin and/or Tylenol per over-the-counter dosing instructions for pain. Please take all of your antibiotics until finished. You may develop abdominal discomfort or diarrhea from the antibiotic.  You may help offset this with probiotics which you can buy at the store (ask your pharmacist if unable to find) or get probiotics in the form of eating yogurt. Do not eat or take the probiotics until 2 hours after your antibiotic. If you are unable to tolerate these side effects follow-up with your primary care provider or return to the emergency department.   If you begin to experience any blistering, rashes, swelling, or difficulty breathing seek medical care for evaluation of potentially more serious side effects.   Please be aware that this medication may interact with other medications you are taking, please be sure to discuss your medication list with your pharmacist.   You need to follow-up in the emergency department, at an urgent care, or with a primary care doctor in 2 days for a wound recheck and for packing removal.  Return sooner for spreading redness, fever, nausea, vomiting, or abdominal pain.   We are unsure of the cause of the lesion you have on your penis.  Please see your primary care doctor for further evaluation of this.  Your results for gonorrhea, chlamydia, syphilis, and HIV are all pending, we will call you with the results.  If these results are positive you will need to receive treatment and inform all sexual partners.  Return to the emergency department anytime for any new or worsening symptoms or any other concerns that you may  have.

## 2017-06-13 NOTE — ED Provider Notes (Signed)
[facility] EMERGENCY DEPARTMENT Provider Note   CSN: [id number] Arrival date & time: 06/13/17  1109     History   Chief Complaint Chief Complaint  Patient presents with  . Penile Discharge    HPI Trevor Gonzalez is a 32 y.o. male with a history of tobacco abuse who presents the emergency department today with concern for penile lesion, concern for STD, and boil to the right leg, all of which seem to have developed over the past 4 days.  Patient states that he has a "bump" to the left lateral aspect of his penile shaft, this area is not painful, there has been no drainage from the area.  States he additionally has had a boil to the right lateral aspect of his leg.  States this area is painful.  He has not tried at home interventions.  No specific alleviating or aggravating factors.  Denies fever, chills, nausea, vomiting, abdominal pain, testicular pain/swelling, penile discharge, or dysuria.  HPI  History reviewed. No pertinent past medical history.  There are no active problems to display for this patient.   History reviewed. No pertinent surgical history.      Home Medications    Prior to Admission medications   Medication Sig Start Date End Date Taking? Authorizing Provider  predniSONE (DELTASONE) 20 MG tablet Take 2 tablets (40 mg total) by mouth daily. 04/16/15   Pisciotta, Mardella Layman    Family History History reviewed. No pertinent family history.  Social History Social History   Tobacco Use  . Smoking status: Current Every Day Smoker  . Smokeless tobacco: Never Used  Substance Use Topics  . Alcohol use: Yes    Comment: occ  . Drug use: No     Allergies   Patient has no known allergies.   Review of Systems Review of Systems  Constitutional: Negative for chills and fever.  Gastrointestinal: Negative for abdominal pain, blood in stool, constipation, diarrhea, nausea, rectal pain and vomiting.  Genitourinary: Negative for discharge, dysuria,  scrotal swelling and testicular pain.  Skin:       Bump to penile shaft. Boil to right leg.     Physical Exam Updated Vital Signs BP (!) 141/92 (BP Location: Right Arm)   Pulse 73   Temp 98.3 F (36.8 C) (Oral)   Resp 16   Ht 5\' 7"  (1.702 m)   Wt 70.3 kg (155 lb)   SpO2 99%   BMI 24.28 kg/m   Physical Exam  Constitutional: He appears well-developed and well-nourished. No distress.  HENT:  Head: Normocephalic and atraumatic.  Eyes: Conjunctivae are normal. Right eye exhibits no discharge. Left eye exhibits no discharge.  Abdominal: Soft. He exhibits no distension. There is no tenderness. There is no rebound and no guarding.  Genitourinary: Testes normal. Right testis shows no mass, no swelling and no tenderness. Left testis shows no mass, no swelling and no tenderness. Circumcised. No penile erythema. No discharge found.  Genitourinary Comments: There is a 0.5 cm hypopigmented area to the left lateral shaft of the penis.  This area is nontender to palpation.  There is no vesicular lesions.  No erythema.  No warmth. EDT Madaline Guthrie present as chaperone.   Neurological: He is alert.  Clear speech.   Skin:  Right lower extremity: Anterior lateral thigh with 2 cm diameter area of fluctuance with approximately 10 cm diameter area of induration.  There is overlying erythema and mild warmth.  Psychiatric: He has a normal mood and affect. His  behavior is normal. Thought content normal.  Nursing note and vitals reviewed.   ED Treatments / Results  Labs Results for orders placed or performed during the hospital encounter of 06/13/17  Urinalysis, Routine w reflex microscopic  Result Value Ref Range   Color, Urine YELLOW YELLOW   APPearance CLEAR CLEAR   Specific Gravity, Urine 1.010 1.005 - 1.030   pH 7.5 5.0 - 8.0   Glucose, UA NEGATIVE NEGATIVE mg/dL   Hgb urine dipstick NEGATIVE NEGATIVE   Bilirubin Urine NEGATIVE NEGATIVE   Ketones, ur NEGATIVE NEGATIVE mg/dL   Protein, ur  NEGATIVE NEGATIVE mg/dL   Nitrite NEGATIVE NEGATIVE   Leukocytes, UA NEGATIVE NEGATIVE   No results found.EKG None  Radiology No results found.  Procedures .Marland Kitchen.Incision and Drainage Date/Time: 06/13/2017 3:23 PM Performed by: Cherly AndersonPetrucelli, Marshayla Mitschke R, PA-C Authorized by: Cherly AndersonPetrucelli, Evadene Wardrip R, PA-C   Consent:    Consent obtained:  Verbal   Consent given by:  Patient   Risks discussed:  Bleeding, incomplete drainage, pain, infection and damage to other organs   Alternatives discussed:  No treatment Location:    Type:  Abscess   Size:  2cm fluctuance, 10 cm induration   Location:  Lower extremity   Lower extremity location:  Leg   Leg location:  R upper leg Pre-procedure details:    Skin preparation:  Betadine Anesthesia (see MAR for exact dosages):    Anesthesia method:  Local infiltration   Local anesthetic:  Lidocaine 2% WITH epi Procedure type:    Complexity:  Simple Procedure details:    Incision types:  Stab incision   Scalpel blade:  11   Wound management:  Probed and deloculated and irrigated with saline   Drainage:  Bloody and purulent   Drainage amount:  Moderate   Packing materials:  1/4 in iodoform gauze Post-procedure details:    Patient tolerance of procedure:  Tolerated well, no immediate complications   (including critical care time)  Medications Ordered in ED Medications  lidocaine-EPINEPHrine (XYLOCAINE W/EPI) 2 %-1:200000 (PF) injection 10 mL (has no administration in time range)     Initial Impression / Assessment and Plan / ED Course  I have reviewed the triage vital signs and the nursing notes.  Pertinent labs & imaging results that were available during my care of the patient were reviewed by me and considered in my medical decision making (see chart for details).   Patient presents with concern for possible STD and with an abscess. Patient is nontoxic appearing, in no apparent distress, vitals WNL other than somewhat elevated BP- no  indication of HTN emergency.   Patient's genital exam without significant abnormalities- there is a hypopigmented lesion to the lateral shaft of the penis which is nontender, not erythematous, no warmth, no vesicular lesions, does not appear consistent with HSV or infectious etiology. GC/chlamydia, HIV, and syphilis cultures pending. Offered prophylactic tx for gc/chlamydia to which patient declined. Patient aware cultures pending. Aware unclear definitive etiology of penile shaft lesion and need for PCP follow up. Explained if STD testing results return positive he would require tx and would need to inform sexual partners.   Patient with skin abscess amenable to incision and drainage.  Procedure per note above. Given surrounding cellulitis will start on doxycycline. Wound recheck in 2 days.    I discussed treatment plan, need for PCP follow-up, need for wound recheck, and return precautions with the patient. Provided opportunity for questions, patient confirmed understanding and is in agreement with plan.  Final Clinical Impressions(s) / ED Diagnoses   Final diagnoses:  Abscess    ED Discharge Orders        Ordered    doxycycline (VIBRAMYCIN) 100 MG capsule  2 times daily     06/13/17 [address] [place], PA-C 06/13/17 1950    Palumbo, April, MD 06/15/17 [phone]

## 2017-06-14 LAB — HIV ANTIBODY (ROUTINE TESTING W REFLEX): HIV SCREEN 4TH GENERATION: NONREACTIVE

## 2017-06-14 LAB — GC/CHLAMYDIA PROBE AMP (~~LOC~~) NOT AT ARMC
Chlamydia: NEGATIVE
Neisseria Gonorrhea: NEGATIVE

## 2017-06-14 LAB — RPR: RPR Ser Ql: NONREACTIVE

## 2017-06-15 ENCOUNTER — Other Ambulatory Visit: Payer: Self-pay

## 2017-06-15 ENCOUNTER — Encounter (HOSPITAL_BASED_OUTPATIENT_CLINIC_OR_DEPARTMENT_OTHER): Payer: Self-pay | Admitting: Emergency Medicine

## 2017-06-15 ENCOUNTER — Emergency Department (HOSPITAL_BASED_OUTPATIENT_CLINIC_OR_DEPARTMENT_OTHER)
Admission: EM | Admit: 2017-06-15 | Discharge: 2017-06-15 | Disposition: A | Discharge status: Home / Self Care | Payer: Self-pay | Attending: Emergency Medicine | Admitting: Emergency Medicine

## 2017-06-15 DIAGNOSIS — Z5189 Encounter for other specified aftercare: Secondary | ICD-10-CM | POA: Insufficient documentation

## 2017-06-15 DIAGNOSIS — L02415 Cutaneous abscess of right lower limb: Secondary | ICD-10-CM | POA: Insufficient documentation

## 2017-06-15 DIAGNOSIS — F17200 Nicotine dependence, unspecified, uncomplicated: Secondary | ICD-10-CM | POA: Insufficient documentation

## 2017-06-15 NOTE — Discharge Instructions (Addendum)
Keep the new dressing on in place for the next 2 days.  Then you can remove it and also remove the packing.  After that wash the wound with soap and water daily and apply Band-Aid over the top of the area.  Return for any new or worse symptoms.  Would expect things to slowly get better each day.  Continue your antibiotic.

## 2017-06-15 NOTE — ED Triage Notes (Signed)
Pt here for recheck of abscess to penis that was drained 2 days ago.

## 2017-06-15 NOTE — ED Provider Notes (Addendum)
[facility] EMERGENCY DEPARTMENT Provider Note   CSN: [id number] Arrival date & time: 06/15/17  7829     History   Chief Complaint Chief Complaint  Patient presents with  . Wound Check    HPI Trevor Gonzalez is a 32 y.o. male.  Patient here for follow-up of abscess that was I&D on his right lateral thigh on 25 March.  Patient states that overall it feels much better.  Still has original dressing in place.  Patient said that the abscess area was quite large most of the swelling is gone down.  Patient taking the antibiotic doxycycline.  Patient also seen for STD exposure concern on that day as well.  Patient chart from the 25th reviewed.  Patient had STD screening.  But did not want prophylactic antibiotics.  Patient's RPR nonreactive general probe negative for chlamydia or gonorrhea.  And HIV screening nonreactive.      History reviewed. No pertinent past medical history.  There are no active problems to display for this patient.   History reviewed. No pertinent surgical history.      Home Medications    Prior to Admission medications   Medication Sig Start Date End Date Taking? Authorizing Provider  doxycycline (VIBRAMYCIN) 100 MG capsule Take 1 capsule (100 mg total) by mouth 2 (two) times daily. 06/13/17   Petrucelli, Samantha R, PA-C  predniSONE (DELTASONE) 20 MG tablet Take 2 tablets (40 mg total) by mouth daily. 04/16/15   Pisciotta, Mardella Layman    Family History No family history on file.  Social History Social History   Tobacco Use  . Smoking status: Current Every Day Smoker  . Smokeless tobacco: Never Used  Substance Use Topics  . Alcohol use: Yes    Comment: occ  . Drug use: No     Allergies   Patient has no known allergies.   Review of Systems Review of Systems  Constitutional: Negative for fever.  HENT: Negative for congestion.   Eyes: Negative for redness.  Respiratory: Negative for shortness of breath.   Cardiovascular:  Negative for chest pain.  Gastrointestinal: Negative for abdominal pain, nausea and vomiting.  Musculoskeletal: Negative for back pain.  Skin: Positive for wound.  Neurological: Negative for syncope.  Hematological: Does not bruise/bleed easily.  Psychiatric/Behavioral: Negative for confusion.     Physical Exam Updated Vital Signs BP (!) 136/94 (BP Location: Right Arm)   Pulse 68   Temp 98.6 F (37 C) (Oral)   Resp 16   Ht 1.702 m (5\' 7" )   Wt 70.3 kg (155 lb)   SpO2 100%   BMI 24.28 kg/m   Physical Exam  Constitutional: He is oriented to person, place, and time. He appears well-developed and well-nourished. No distress.  HENT:  Head: Normocephalic and atraumatic.  Mouth/Throat: Oropharynx is clear and moist.  Eyes: Pupils are equal, round, and reactive to light. EOM are normal.  Neck: Neck supple.  Cardiovascular: Normal rate.  Pulmonary/Chest: Effort normal and breath sounds normal.  Abdominal: Soft. Bowel sounds are normal.  Musculoskeletal:  Right lateral thigh with dressing over the site of the incision and drainage of the abscess.  That was removed.  Packing still in place.  Still some surrounding induration measuring about 4 x 4 cm.  No evidence of any fluctuance.  Still some purulent discharge mixed with blood.  Neurological: He is alert and oriented to person, place, and time. No cranial nerve deficit or sensory deficit. He exhibits normal muscle tone. Coordination normal.  Skin: Skin is warm.  Nursing note and vitals reviewed.    ED Treatments / Results  Labs (all labs ordered are listed, but only abnormal results are displayed) Labs Reviewed - No data to display  EKG None  Radiology No results found.  Procedures Procedures (including critical care time)  Medications Ordered in ED Medications - No data to display   Initial Impression / Assessment and Plan / ED Course  I have reviewed the triage vital signs and the nursing notes.  Pertinent labs  & imaging results that were available during my care of the patient were reviewed by me and considered in my medical decision making (see chart for details).     Patient states that overall the abscess area is improved significantly.  Swelling is gone down a lot.  York SpanielSaid this is much better than it looked 2 days ago.  We will redress with the packing in place have him keep that dressing in place for 2 more days then he can remove the dressing and packing.  He will return for any new or worse symptoms.  He will continue his doxycycline.  Final Clinical Impressions(s) / ED Diagnoses   Final diagnoses:  Wound check, abscess    ED Discharge Orders    None       Vanetta MuldersZackowski, Braedyn Kauk, MD 06/15/17 1009    Vanetta MuldersZackowski, Alleigh Mollica, MD 06/15/17 1011

## 2017-10-19 ENCOUNTER — Other Ambulatory Visit: Payer: Self-pay

## 2017-10-19 ENCOUNTER — Encounter (HOSPITAL_BASED_OUTPATIENT_CLINIC_OR_DEPARTMENT_OTHER): Payer: Self-pay

## 2017-10-19 ENCOUNTER — Emergency Department (HOSPITAL_BASED_OUTPATIENT_CLINIC_OR_DEPARTMENT_OTHER)
Admission: EM | Admit: 2017-10-19 | Discharge: 2017-10-19 | Disposition: A | Discharge status: Home / Self Care | Payer: Self-pay | Attending: Emergency Medicine | Admitting: Emergency Medicine

## 2017-10-19 DIAGNOSIS — K047 Periapical abscess without sinus: Secondary | ICD-10-CM | POA: Insufficient documentation

## 2017-10-19 DIAGNOSIS — F17210 Nicotine dependence, cigarettes, uncomplicated: Secondary | ICD-10-CM | POA: Insufficient documentation

## 2017-10-19 DIAGNOSIS — Z79899 Other long term (current) drug therapy: Secondary | ICD-10-CM | POA: Insufficient documentation

## 2017-10-19 MED ORDER — HYDROCODONE-ACETAMINOPHEN 5-325 MG PO TABS
2.0000 | ORAL_TABLET | Freq: Once | ORAL | Status: AC
  Administered 2017-10-19: 2 via ORAL
  Filled 2017-10-19: qty 2

## 2017-10-19 MED ORDER — PENICILLIN V POTASSIUM 250 MG PO TABS
500.0000 mg | ORAL_TABLET | Freq: Once | ORAL | Status: AC
  Administered 2017-10-19: 500 mg via ORAL
  Filled 2017-10-19: qty 2

## 2017-10-19 MED ORDER — IBUPROFEN 800 MG PO TABS: 800.0000 mg | ORAL_TABLET | Freq: Three times a day (TID) | ORAL | 0 refills | Status: DC | PRN

## 2017-10-19 MED ORDER — PENICILLIN V POTASSIUM 500 MG PO TABS: 500.0000 mg | ORAL_TABLET | Freq: Four times a day (QID) | ORAL | 0 refills | Status: AC

## 2017-10-19 NOTE — ED Triage Notes (Signed)
Pt entered triage drinking iced beverage-c/o left lower toothache x 3 days-NAD

## 2017-10-19 NOTE — ED Provider Notes (Signed)
[facility] EMERGENCY DEPARTMENT Provider Note   CSN: [id number] Arrival date & time: 10/19/17  1910     History   Chief Complaint Chief Complaint  Patient presents with  . Dental Pain    HPI Trevor Gonzalez is a 32 y.o. male.  He is complaining worsening left lower toothache for 3 days.  He had a cavity in that area before and it started getting more severe.  He denies any fever.  He is tried Tylenol and ibuprofen without any relief.  The pain is moderate to severe and throbbing and is mildly relieved when he drinks cold liquid.  He does not have a dentist.  Is not associated with any facial swelling  The history is provided by the patient.  Dental Pain   This is a new problem. The current episode started more than 2 days ago. The problem occurs constantly. The problem has not changed since onset.The pain is moderate. He has tried acetaminophen for the symptoms. The treatment provided mild relief.    History reviewed. No pertinent past medical history.  There are no active problems to display for this patient.   History reviewed. No pertinent surgical history.      Home Medications    Prior to Admission medications   Medication Sig Start Date End Date Taking? Authorizing Provider  doxycycline (VIBRAMYCIN) 100 MG capsule Take 1 capsule (100 mg total) by mouth 2 (two) times daily. 06/13/17   Petrucelli, Samantha R, PA-C  predniSONE (DELTASONE) 20 MG tablet Take 2 tablets (40 mg total) by mouth daily. 04/16/15   Pisciotta, Mardella LaymanNicole, PA-C    Family History No family history on file.  Social History Social History   Tobacco Use  . Smoking status: Current Every Day Smoker    Types: Cigarettes  . Smokeless tobacco: Never Used  Substance Use Topics  . Alcohol use: Yes    Comment: daily  . Drug use: No     Allergies   Patient has no known allergies.   Review of Systems Review of Systems  Constitutional: Negative for fever.  HENT: Negative for sore  throat.   Respiratory: Negative for shortness of breath.   Cardiovascular: Negative for chest pain.  Gastrointestinal: Negative for abdominal pain.  Genitourinary: Negative for dysuria.  Skin: Negative for rash.     Physical Exam Updated Vital Signs BP (!) 159/102 (BP Location: Left Arm)   Pulse 87   Resp 18   Ht 5\' 7"  (1.702 m)   Wt 73.5 kg (162 lb)   SpO2 100%   BMI 25.37 kg/m   Physical Exam  Constitutional: He appears well-developed and well-nourished.  HENT:  Head: Normocephalic and atraumatic.  Right Ear: External ear normal.  Left Ear: External ear normal.  Nose: Nose normal.  Mouth/Throat: Oropharynx is clear and moist.  Patient has multiple dental caries and dental fractures.  The left lower posterior molar is mostly eroded out but tender.  There is no significant gum fluctuance and no facial edema.  Posterior floor of the mouth is soft.  Eyes: Conjunctivae are normal.  Neck: Neck supple.  Pulmonary/Chest: Effort normal.  Neurological: He is alert. GCS eye subscore is 4. GCS verbal subscore is 5. GCS motor subscore is 6.  Skin: Skin is warm and dry.  Psychiatric: He has a normal mood and affect.  Nursing note and vitals reviewed.    ED Treatments / Results  Labs (all labs ordered are listed, but only abnormal results are displayed) Labs  Reviewed - No data to display  EKG None  Radiology No results found.  Procedures Procedures (including critical care time)  Medications Ordered in ED Medications  HYDROcodone-acetaminophen (NORCO/VICODIN) 5-325 MG per tablet 2 tablet (has no administration in time range)  penicillin v potassium (VEETID) tablet 500 mg (has no administration in time range)     Initial Impression / Assessment and Plan / ED Course  I have reviewed the triage vital signs and the nursing notes.  Pertinent labs & imaging results that were available during my care of the patient were reviewed by me and considered in my medical decision  making (see chart for details).      Final Clinical Impressions(s) / ED Diagnoses   Final diagnoses:  Dental infection    ED Discharge Orders        Ordered    ibuprofen (ADVIL,MOTRIN) 800 MG tablet  Every 8 hours PRN     10/19/17 1928    penicillin v potassium (VEETID) 500 MG tablet  4 times daily     10/19/17 1928       Terrilee Files, MD 10/20/17 [phone]

## 2017-10-19 NOTE — Discharge Instructions (Addendum)
He was seen in the emergency department for left-sided dental pain.  You likely have an infection in the tooth and we are prescribe you antibiotics.  You should continue to use Tylenol and ibuprofen.  It will be important for you to follow-up with a dentist.

## 2017-11-22 ENCOUNTER — Emergency Department (HOSPITAL_BASED_OUTPATIENT_CLINIC_OR_DEPARTMENT_OTHER): Payer: Self-pay

## 2017-11-22 ENCOUNTER — Other Ambulatory Visit: Payer: Self-pay

## 2017-11-22 ENCOUNTER — Emergency Department (HOSPITAL_BASED_OUTPATIENT_CLINIC_OR_DEPARTMENT_OTHER)
Admission: EM | Admit: 2017-11-22 | Discharge: 2017-11-22 | Disposition: A | Discharge status: Home / Self Care | Payer: Self-pay | Attending: Emergency Medicine | Admitting: Emergency Medicine

## 2017-11-22 ENCOUNTER — Encounter (HOSPITAL_BASED_OUTPATIENT_CLINIC_OR_DEPARTMENT_OTHER): Payer: Self-pay

## 2017-11-22 DIAGNOSIS — F17210 Nicotine dependence, cigarettes, uncomplicated: Secondary | ICD-10-CM | POA: Insufficient documentation

## 2017-11-22 DIAGNOSIS — R072 Precordial pain: Secondary | ICD-10-CM | POA: Insufficient documentation

## 2017-11-22 HISTORY — DX: Bronchitis, not specified as acute or chronic: J40

## 2017-11-22 LAB — BASIC METABOLIC PANEL
Anion gap: 9 (ref 5–15)
BUN: 12 mg/dL (ref 6–20)
CHLORIDE: 102 mmol/L (ref 98–111)
CO2: 28 mmol/L (ref 22–32)
CREATININE: 1.17 mg/dL (ref 0.61–1.24)
Calcium: 9.7 mg/dL (ref 8.9–10.3)
GFR calc non Af Amer: >60 mL/min (ref >60)
GLUCOSE: 98 mg/dL (ref 70–99)
Potassium: 3.3 mmol/L — ABNORMAL LOW (ref 3.5–5.1)
Sodium: 139 mmol/L (ref 135–145)

## 2017-11-22 LAB — CBC
HEMATOCRIT: 40.3 % (ref 39.0–52.0)
HEMOGLOBIN: 13.7 g/dL (ref 13.0–17.0)
MCH: 27.5 pg (ref 26.0–34.0)
MCHC: 34 g/dL (ref 30.0–36.0)
MCV: 80.9 fL (ref 78.0–100.0)
Platelets: 207 10*3/uL (ref 150–400)
RBC: 4.98 MIL/uL (ref 4.22–5.81)
RDW: 14 % (ref 11.5–15.5)
WBC: 6 10*3/uL (ref 4.0–10.5)

## 2017-11-22 LAB — D-DIMER, QUANTITATIVE: D-Dimer, Quant: <0.27 ug/mL-FEU (ref 0.00–0.50)

## 2017-11-22 LAB — TROPONIN I

## 2017-11-22 MED ORDER — NAPROXEN 500 MG PO TABS: 500.0000 mg | ORAL_TABLET | Freq: Two times a day (BID) | ORAL | 0 refills | Status: DC

## 2017-11-22 NOTE — ED Triage Notes (Signed)
C/o SOB x 3 days-denies flu like sx-also states chest tightness x 3 days-NAD-steady gait

## 2017-11-22 NOTE — ED Provider Notes (Signed)
[facility] EMERGENCY DEPARTMENT Provider Note   CSN: [id number] Arrival date & time: 11/22/17  1635     History   Chief Complaint Chief Complaint  Patient presents with  . Shortness of Breath    HPI Trevor Gonzalez is a 32 y.o. male.  Patient presents with a complaint of right-sided chest pain tightness feeling radiating to the right upper arm.  Also some left upper chest pain as well.  Started on Sunday so has been going on for 3 days.  There is associated shortness of breath no nausea or vomiting no fevers.  Pain on the right side is worse with movement but not so on the left.  No prior history of similar pain.  No history of injury.     Past Medical History:  Diagnosis Date  . Bronchitis     There are no active problems to display for this patient.   History reviewed. No pertinent surgical history.      Home Medications    Prior to Admission medications   Medication Sig Start Date End Date Taking? Authorizing Provider  doxycycline (VIBRAMYCIN) 100 MG capsule Take 1 capsule (100 mg total) by mouth 2 (two) times daily. 06/13/17   Petrucelli, Samantha R, PA-C  ibuprofen (ADVIL,MOTRIN) 800 MG tablet Take 1 tablet (800 mg total) by mouth every 8 (eight) hours as needed for moderate pain. 10/19/17   Terrilee Files, MD  naproxen (NAPROSYN) 500 MG tablet Take 1 tablet (500 mg total) by mouth 2 (two) times daily. 11/22/17   Vanetta Mulders, MD  predniSONE (DELTASONE) 20 MG tablet Take 2 tablets (40 mg total) by mouth daily. 04/16/15   Pisciotta, Mardella Layman    Family History No family history on file.  Social History Social History   Tobacco Use  . Smoking status: Current Every Day Smoker    Types: Cigarettes  . Smokeless tobacco: Never Used  Substance Use Topics  . Alcohol use: Yes    Comment: weekly  . Drug use: No     Allergies   Patient has no known allergies.   Review of Systems Review of Systems  Constitutional: Negative for fever.    HENT: Negative for congestion.   Eyes: Negative for redness.  Respiratory: Negative for shortness of breath.   Cardiovascular: Positive for chest pain. Negative for palpitations and leg swelling.  Gastrointestinal: Negative for abdominal pain, nausea and vomiting.  Genitourinary: Negative for dysuria.  Musculoskeletal: Negative for back pain.  Skin: Negative for rash.  Neurological: Negative for syncope and headaches.  Hematological: Does not bruise/bleed easily.  Psychiatric/Behavioral: Negative for confusion.     Physical Exam Updated Vital Signs BP (!) 154/89   Pulse 64   Temp 98.3 F (36.8 C) (Oral)   Resp 17   Ht 1.702 m (5\' 7" )   Wt 72.6 kg   SpO2 100%   BMI 25.06 kg/m   Physical Exam  Constitutional: He is oriented to person, place, and time. He appears well-developed and well-nourished. No distress.  HENT:  Head: Normocephalic and atraumatic.  Mouth/Throat: Oropharynx is clear and moist.  Eyes: Pupils are equal, round, and reactive to light. Conjunctivae and EOM are normal.  Neck: Normal range of motion. Neck supple.  Cardiovascular: Normal rate, regular rhythm and normal heart sounds.  Pulmonary/Chest: Effort normal and breath sounds normal. No respiratory distress. He exhibits no tenderness.  Abdominal: Soft. Bowel sounds are normal. There is no tenderness.  Musculoskeletal: Normal range of motion. He exhibits no  edema.  Neurological: He is alert and oriented to person, place, and time. No cranial nerve deficit or sensory deficit. He exhibits normal muscle tone. Coordination normal.  Skin: Skin is warm. No rash noted.  Nursing note and vitals reviewed.    ED Treatments / Results  Labs (all labs ordered are listed, but only abnormal results are displayed) Labs Reviewed  BASIC METABOLIC PANEL - Abnormal; Notable for the following components:      Result Value   Potassium 3.3 (*)    All other components within normal limits  CBC  TROPONIN I  D-DIMER,  QUANTITATIVE (NOT AT [facility])    EKG EKG Interpretation  Date/Time:  Tuesday November 22 2017 16:48:10 EDT Ventricular Rate:  80 PR Interval:  128 QRS Duration: 96 QT Interval:  366 QTC Calculation: 422 R Axis:   65 Text Interpretation:  Normal sinus rhythm with sinus arrhythmia Minimal voltage criteria for LVH, may be normal variant Borderline ECG Early repolarization Confirmed by Vanetta Mulders [phone]) on 11/22/2017 6:01:47 PM   Radiology Dg Chest 2 View  Result Date: 11/22/2017 CLINICAL DATA:  Shortness of breath with chest pain EXAM: CHEST - 2 VIEW COMPARISON:  04/16/2015 FINDINGS: The heart size and mediastinal contours are within normal limits. Both lungs are clear. The visualized skeletal structures are unremarkable. IMPRESSION: No active cardiopulmonary disease. Electronically Signed   By: Jasmine Pang M.D.   On: 11/22/2017 17:06    Procedures Procedures (including critical care time)  Medications Ordered in ED Medications - No data to display   Initial Impression / Assessment and Plan / ED Course  I have reviewed the triage vital signs and the nursing notes.  Pertinent labs & imaging results that were available during my care of the patient were reviewed by me and considered in my medical decision making (see chart for details).     Work-up for the chest pain without any acute findings.  EKG without any significant abnormalities.  Chest x-ray negative.  D-dimer negative for pulmonary embolus.  And troponin negative.  Patient's basically had pain for 3 days so if it was cardiac in nature would expect troponin to be elevated.  Some of the symptoms are chest wall in nature.  Will give a course of Naprosyn to take for the next 7 days.  Patient will return for any new or worse symptoms.  Patient's past medical history is noncontributory he is a smoker.  No other significant cardiac risk factors.  Final Clinical Impressions(s) / ED Diagnoses   Final diagnoses:  Precordial  pain    ED Discharge Orders         Ordered    naproxen (NAPROSYN) 500 MG tablet  2 times daily     11/22/17 1954           Vanetta Mulders, MD 11/22/17 2001

## 2017-11-22 NOTE — Discharge Instructions (Signed)
Today's work-up without any acute findings.  Return for any new or worse symptoms.  No evidence of any blood clots in the lungs.  No lung abnormality seen on chest x-ray.  No evidence of any heart problem.  Suspect pain may be chest wall in nature.  Take the Naprosyn as directed for the next 7 days.  Work note provided as needed.

## 2017-12-01 IMAGING — DX DG CHEST 2V
2 series · 2 of 2 positions shown · non-contrast
Comparison: 05/13/2010

CLINICAL DATA: Shortness of breath, left-sided chest tightness for
4 days

EXAM:
CHEST  2 VIEW

[chest pa]
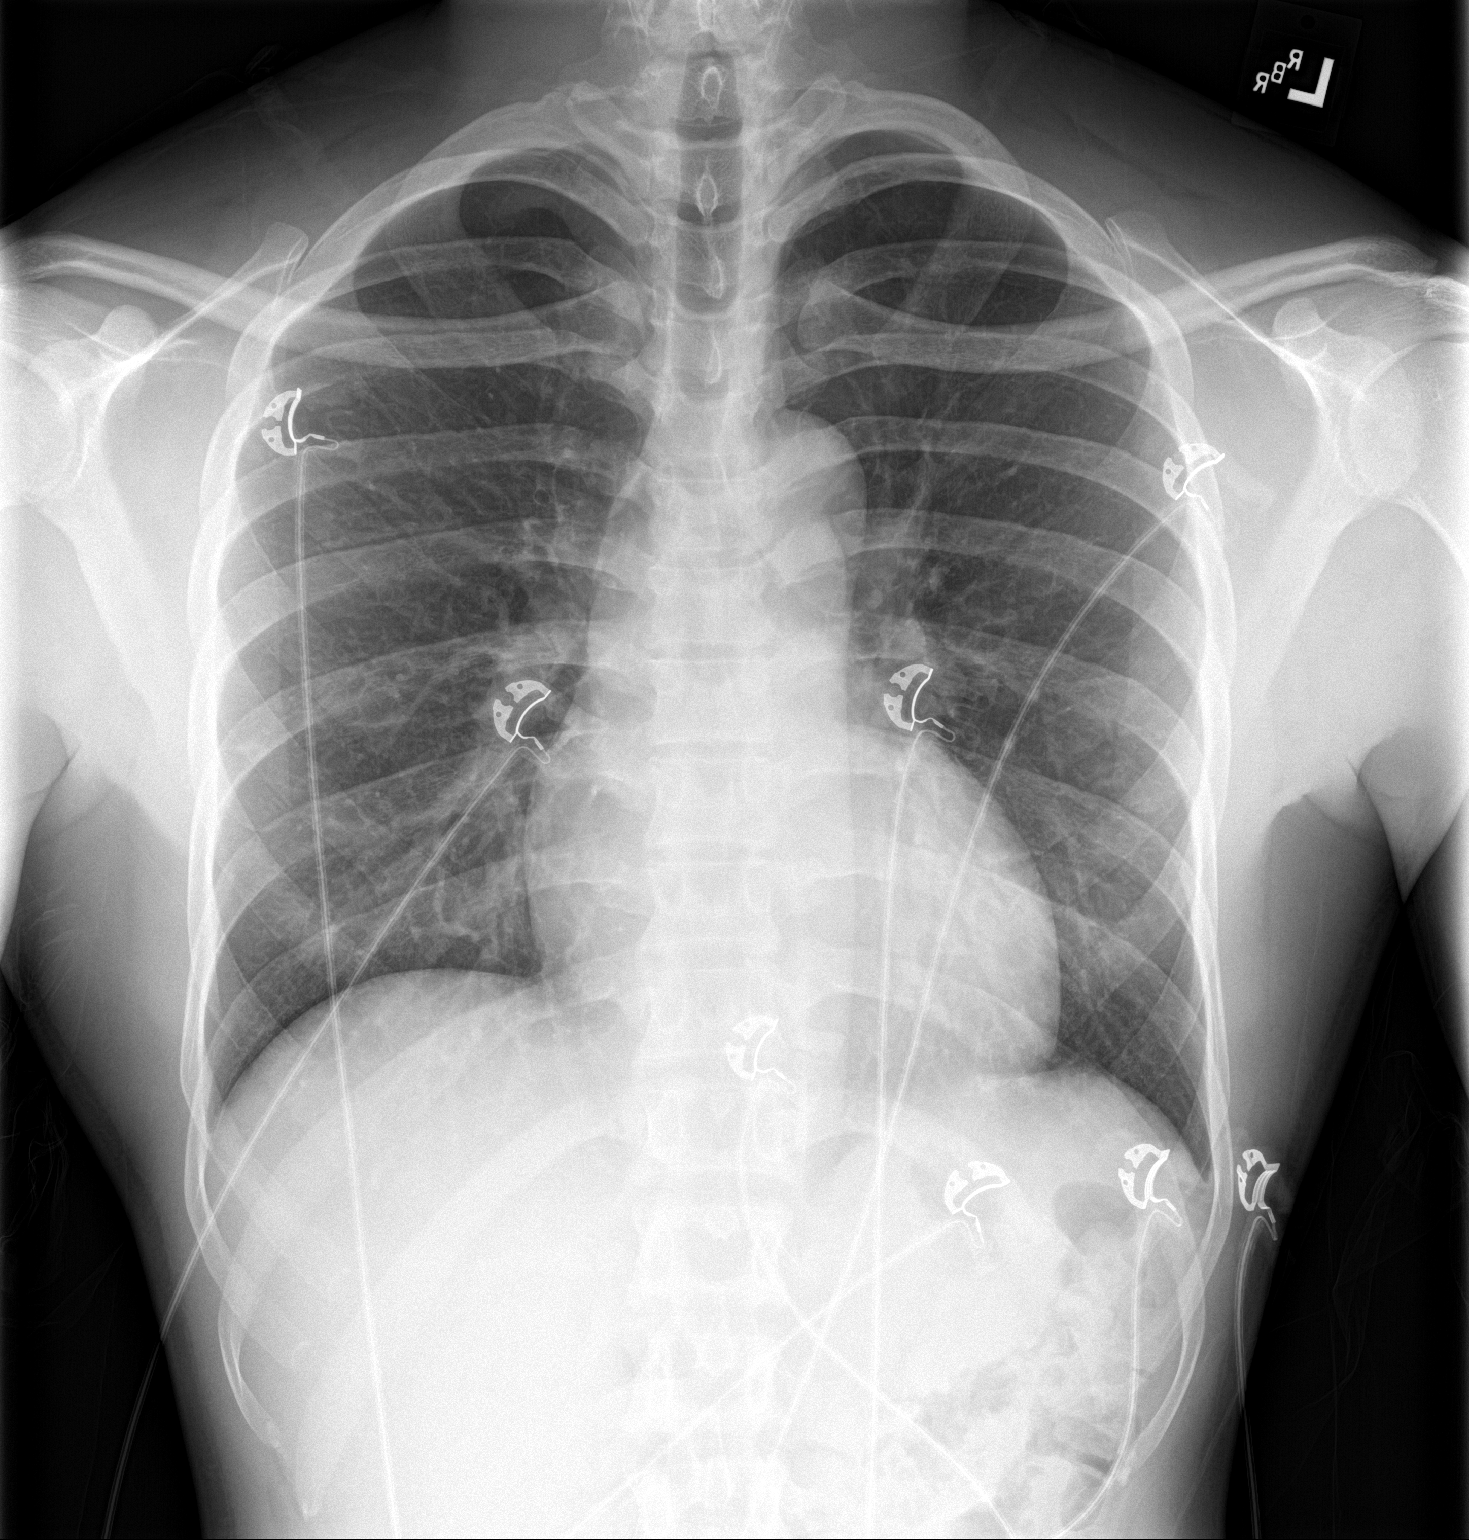

[chest lat]
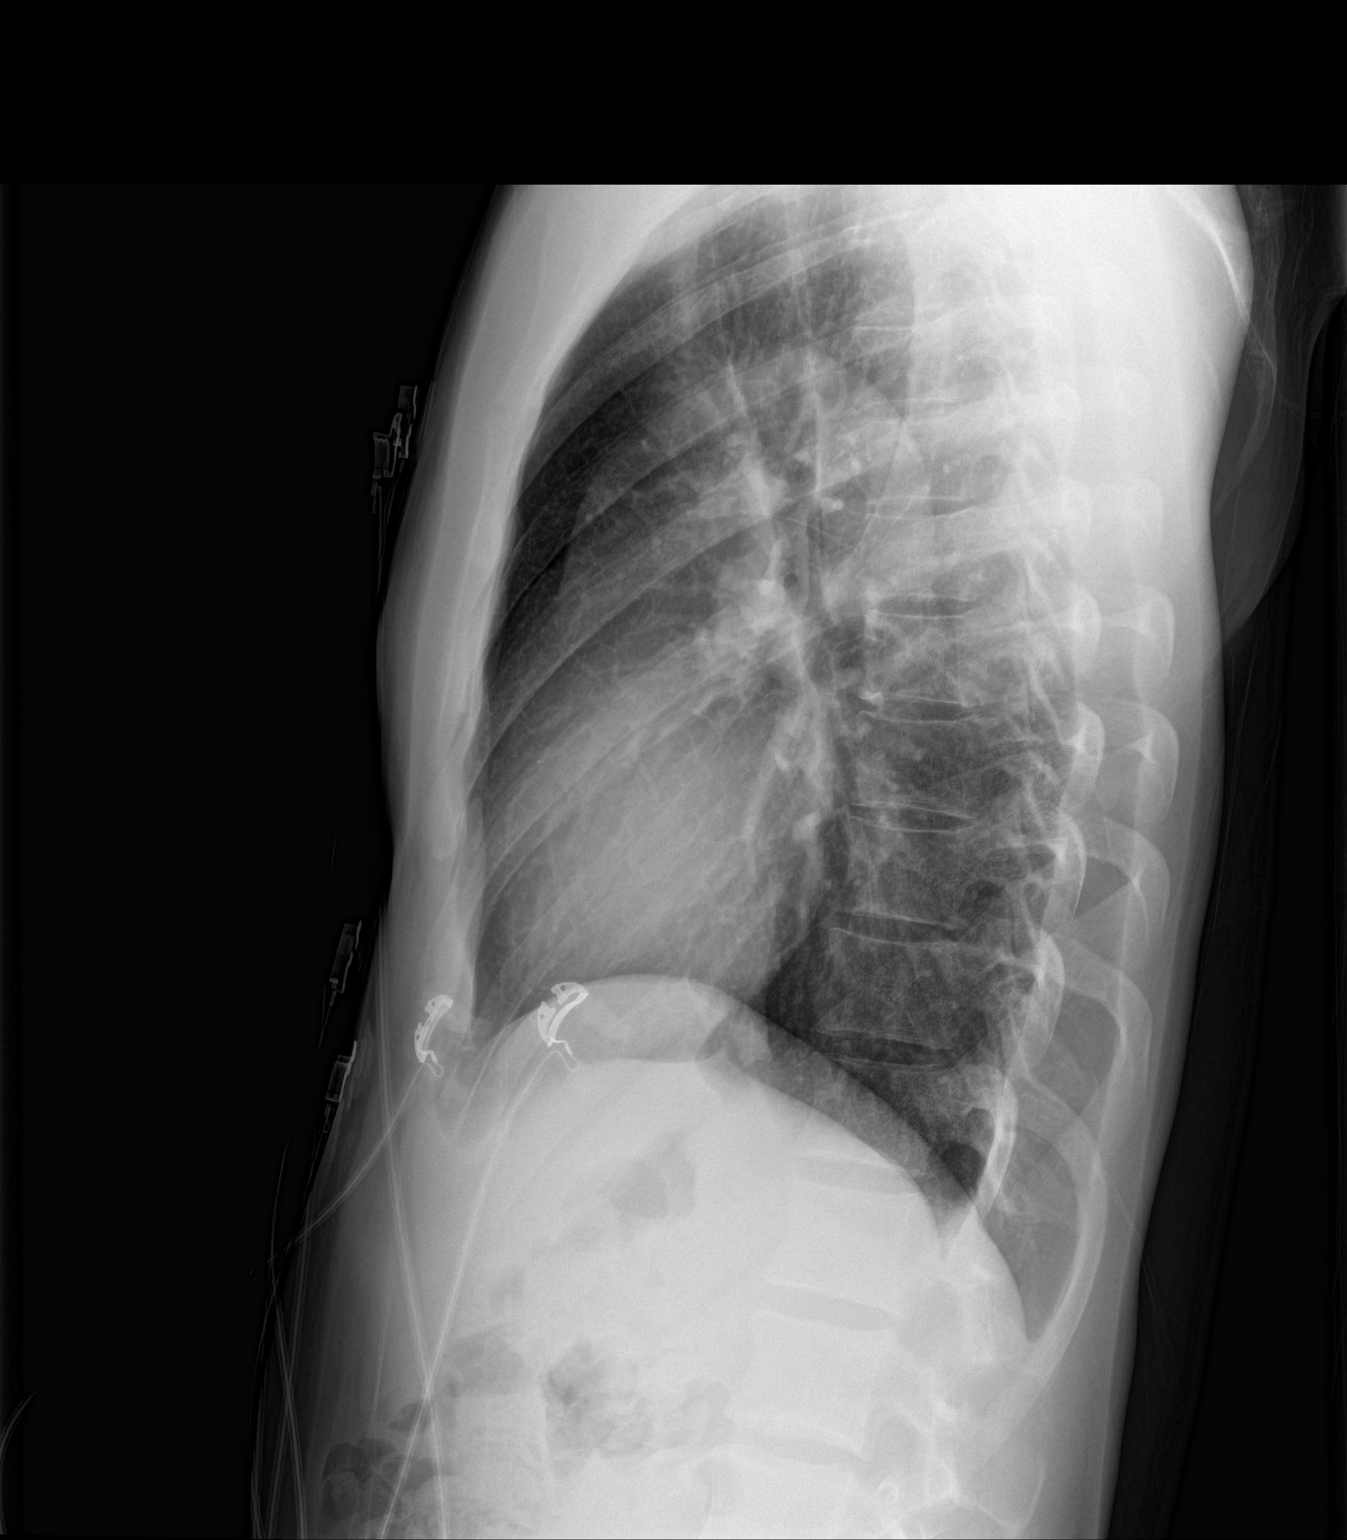

[2 of 2 positions shown; findings below may reference images not displayed]

FINDINGS: The heart size and mediastinal contours are within normal limits.
Both lungs are clear. The visualized skeletal structures are
unremarkable.
IMPRESSION: No active cardiopulmonary disease.

## 2018-11-01 ENCOUNTER — Other Ambulatory Visit: Payer: Self-pay

## 2018-11-01 DIAGNOSIS — Z20822 Contact with and (suspected) exposure to covid-19: Secondary | ICD-10-CM

## 2018-11-02 LAB — NOVEL CORONAVIRUS, NAA: SARS-CoV-2, NAA: NOT DETECTED

## 2019-09-05 ENCOUNTER — Other Ambulatory Visit: Payer: Self-pay

## 2019-09-05 ENCOUNTER — Emergency Department (HOSPITAL_BASED_OUTPATIENT_CLINIC_OR_DEPARTMENT_OTHER)
Admission: EM | Admit: 2019-09-05 | Discharge: 2019-09-05 | Disposition: A | Discharge status: Home / Self Care | Payer: Self-pay | Attending: Emergency Medicine | Admitting: Emergency Medicine

## 2019-09-05 ENCOUNTER — Encounter (HOSPITAL_BASED_OUTPATIENT_CLINIC_OR_DEPARTMENT_OTHER): Payer: Self-pay | Admitting: Emergency Medicine

## 2019-09-05 ENCOUNTER — Emergency Department (HOSPITAL_BASED_OUTPATIENT_CLINIC_OR_DEPARTMENT_OTHER): Payer: Self-pay

## 2019-09-05 DIAGNOSIS — R062 Wheezing: Secondary | ICD-10-CM | POA: Insufficient documentation

## 2019-09-05 DIAGNOSIS — Z20822 Contact with and (suspected) exposure to covid-19: Secondary | ICD-10-CM | POA: Insufficient documentation

## 2019-09-05 DIAGNOSIS — R05 Cough: Secondary | ICD-10-CM | POA: Insufficient documentation

## 2019-09-05 DIAGNOSIS — R0602 Shortness of breath: Secondary | ICD-10-CM | POA: Insufficient documentation

## 2019-09-05 DIAGNOSIS — R0789 Other chest pain: Secondary | ICD-10-CM | POA: Insufficient documentation

## 2019-09-05 DIAGNOSIS — F17210 Nicotine dependence, cigarettes, uncomplicated: Secondary | ICD-10-CM | POA: Insufficient documentation

## 2019-09-05 LAB — CBC
HCT: 39.7 % (ref 39.0–52.0)
Hemoglobin: 13.1 g/dL (ref 13.0–17.0)
MCH: 27.7 pg (ref 26.0–34.0)
MCHC: 33 g/dL (ref 30.0–36.0)
MCV: 83.9 fL (ref 80.0–100.0)
Platelets: 252 10*3/uL (ref 150–400)
RBC: 4.73 MIL/uL (ref 4.22–5.81)
RDW: 14.6 % (ref 11.5–15.5)
WBC: 8.2 10*3/uL (ref 4.0–10.5)
nRBC: 0 % (ref 0.0–0.2)

## 2019-09-05 LAB — TROPONIN I (HIGH SENSITIVITY)
Troponin I (High Sensitivity): 6 ng/L (ref <18)
Troponin I (High Sensitivity): 6 ng/L (ref <18)

## 2019-09-05 LAB — BASIC METABOLIC PANEL
Anion gap: 10 (ref 5–15)
BUN: 17 mg/dL (ref 6–20)
CO2: 25 mmol/L (ref 22–32)
Calcium: 9.1 mg/dL (ref 8.9–10.3)
Chloride: 103 mmol/L (ref 98–111)
Creatinine, Ser: 1.35 mg/dL — ABNORMAL HIGH (ref 0.61–1.24)
GFR calc Af Amer: >60 mL/min (ref >60)
GFR calc non Af Amer: >60 mL/min (ref >60)
Glucose, Bld: 104 mg/dL — ABNORMAL HIGH (ref 70–99)
Potassium: 3.7 mmol/L (ref 3.5–5.1)
Sodium: 138 mmol/L (ref 135–145)

## 2019-09-05 LAB — SARS CORONAVIRUS 2 BY RT PCR (HOSPITAL ORDER, PERFORMED IN ~~LOC~~ HOSPITAL LAB): SARS Coronavirus 2: NEGATIVE

## 2019-09-05 MED ORDER — ALBUTEROL SULFATE HFA 108 (90 BASE) MCG/ACT IN AERS
2.0000 | INHALATION_SPRAY | Freq: Once | RESPIRATORY_TRACT | Status: AC
  Administered 2019-09-05: 2 via RESPIRATORY_TRACT
  Filled 2019-09-05: qty 6.7

## 2019-09-05 MED ORDER — PREDNISONE 50 MG PO TABS: 50.0000 mg | ORAL_TABLET | Freq: Every day | ORAL | 0 refills | Status: AC

## 2019-09-05 NOTE — ED Triage Notes (Signed)
Pt here with ongoing SOB x 5 months and chest pain x1 week.

## 2019-09-05 NOTE — Discharge Instructions (Signed)
Your history and exam today are consistent with a likely undiagnosed reactive airway disease causing the wheezing and shortness of breath and chest tightness.  The x-ray did not show pneumonia and your labs are otherwise reassuring.  There was no evidence of a cardiac cause of your symptoms at this time.  We sent a Covid swab off which will return overnight, please follow-up with my chart for this result.  Please also follow-up with a PCP and take the steroids for the next 5 days for the inflammation in your lungs.  Please use the inhaler we provided.  If any symptoms change or worsen, please return to the nearest emergency department.  Please rest and stay hydrated.

## 2019-09-05 NOTE — ED Provider Notes (Signed)
[facility] Provider Note   CSN: [id number] Arrival date & time: 09/05/19  1751     History Chief Complaint  Patient presents with  . Chest Pain  . Shortness of Breath    [facility] is a 34 y.o. male.  The history is provided by the patient, medical records and a relative. No language interpreter was used.  Shortness of Breath Severity:  Moderate Onset quality:  Gradual Duration:  6 months Timing:  Constant Progression:  Waxing and waning Chronicity:  Chronic Context: activity, occupational exposure, pollens, smoke exposure and weather changes   Context: not URI   Relieved by:  Nothing Worsened by:  Nothing Ineffective treatments:  None tried Associated symptoms: cough and wheezing   Associated symptoms: no abdominal pain, no chest pain, no diaphoresis, no fever, no headaches, no hemoptysis, no neck pain, no rash, no sore throat, no sputum production, no swollen glands and no vomiting   Risk factors: no hx of PE/DVT        Past Medical History:  Diagnosis Date  . Bronchitis     There are no problems to display for this patient.   History reviewed. No pertinent surgical history.     History reviewed. No pertinent family history.  Social History   Tobacco Use  . Smoking status: Current Every Day Smoker    Types: Cigarettes  . Smokeless tobacco: Never Used  Substance Use Topics  . Alcohol use: Yes    Comment: weekly  . Drug use: No    Home Medications Prior to Admission medications   Medication Sig Start Date End Date Taking? Authorizing Provider  doxycycline (VIBRAMYCIN) 100 MG capsule Take 1 capsule (100 mg total) by mouth 2 (two) times daily. 06/13/17   Petrucelli, Samantha R, PA-C  ibuprofen (ADVIL,MOTRIN) 800 MG tablet Take 1 tablet (800 mg total) by mouth every 8 (eight) hours as needed for moderate pain. 10/19/17   Hayden Rasmussen, MD  naproxen (NAPROSYN) 500 MG tablet Take 1 tablet (500 mg total) by mouth 2  (two) times daily. 11/22/17   Fredia Sorrow, MD  predniSONE (DELTASONE) 20 MG tablet Take 2 tablets (40 mg total) by mouth daily. 04/16/15   Pisciotta, Elmyra Ricks, PA-C    Allergies    Patient has no known allergies.  Review of Systems   Review of Systems  Constitutional: Negative for chills, diaphoresis, fatigue and fever.  HENT: Negative for congestion and sore throat.   Eyes: Negative for visual disturbance.  Respiratory: Positive for cough, chest tightness, shortness of breath and wheezing. Negative for hemoptysis, sputum production and stridor.   Cardiovascular: Negative for chest pain, palpitations and leg swelling.  Gastrointestinal: Negative for abdominal pain, constipation, diarrhea, nausea and vomiting.  Musculoskeletal: Negative for back pain, neck pain and neck stiffness.  Skin: Negative for rash and wound.  Neurological: Negative for weakness, light-headedness, numbness and headaches.  Psychiatric/Behavioral: Negative for agitation.  All other systems reviewed and are negative.   Physical Exam Updated Vital Signs BP (!) 155/104 (BP Location: Right Arm)   Pulse 95   Temp 98 F (36.7 C) (Oral)   Resp 16   SpO2 100%   Physical Exam Vitals and nursing note reviewed.  Constitutional:      General: He is not in acute distress.    Appearance: He is well-developed. He is not ill-appearing, toxic-appearing or diaphoretic.  HENT:     Head: Normocephalic and atraumatic.  Eyes:     Extraocular Movements: Extraocular  movements intact.     Conjunctiva/sclera: Conjunctivae normal.     Pupils: Pupils are equal, round, and reactive to light.  Cardiovascular:     Rate and Rhythm: Normal rate and regular rhythm.     Heart sounds: No murmur heard.   Pulmonary:     Effort: Pulmonary effort is normal. No respiratory distress.     Breath sounds: Wheezing present. No decreased breath sounds, rhonchi or rales.  Chest:     Chest wall: Tenderness present.  Abdominal:     Palpations:  Abdomen is soft.     Tenderness: There is no abdominal tenderness.  Musculoskeletal:     Cervical back: Neck supple.     Right lower leg: No tenderness. No edema.     Left lower leg: No tenderness. No edema.  Skin:    General: Skin is warm and dry.     Capillary Refill: Capillary refill takes less than 2 seconds.  Neurological:     General: No focal deficit present.     Mental Status: He is alert.  Psychiatric:        Mood and Affect: Mood normal.     ED Results / Procedures / Treatments   Labs (all labs ordered are listed, but only abnormal results are displayed) Labs Reviewed  BASIC METABOLIC PANEL - Abnormal; Notable for the following components:      Result Value   Glucose, Bld 104 (*)    Creatinine, Ser 1.35 (*)    All other components within normal limits  SARS CORONAVIRUS 2 BY RT PCR (HOSPITAL ORDER, PERFORMED IN [facility])  CBC  TROPONIN I (HIGH SENSITIVITY)  TROPONIN I (HIGH SENSITIVITY)    EKG EKG Interpretation  Date/Time:  Wednesday September 05 2019 17:55:36 EDT Ventricular Rate:  79 PR Interval:  150 QRS Duration: 90 QT Interval:  378 QTC Calculation: 433 R Axis:   13 Text Interpretation: Normal sinus rhythm Possible Left atrial enlargement Nonspecific T wave abnormality Abnormal ECG When compared to prior, no significant cahnges seen. No STEMI Confirmed by Theda Belfast ([id number]) on 09/05/2019 6:29:39 PM   Radiology DG Chest 2 View  Result Date: 09/05/2019 CLINICAL DATA:  Left upper quadrant pain EXAM: CHEST - 2 VIEW COMPARISON:  11/22/2017 FINDINGS: The heart size and mediastinal contours are within normal limits. Both lungs are clear. The visualized skeletal structures are unremarkable. IMPRESSION: No active cardiopulmonary disease. Electronically Signed   By: Jasmine Pang M.D.   On: 09/05/2019 18:30    Procedures Procedures (including critical care time)  Medications Ordered in ED Medications  albuterol (VENTOLIN HFA) 108 (90 Base)  MCG/ACT inhaler 2 puff (2 puffs Inhalation Given 09/05/19 1957)    ED Course  I have reviewed the triage vital signs and the nursing notes.  Pertinent labs & imaging results that were available during my care of the patient were reviewed by me and considered in my medical decision making (see chart for details).    MDM Rules/Calculators/A&P                          [facility] is a 34 y.o. male with a past medical history significant for prior bronchitis who presents with months of shortness of breath and 1 week of worsening symptoms and chest tightness.  Patient reports that he has had many months of shortness of breath but has never been diagnosed with COPD or asthma.  He does smoke.  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  He reports his chest is tight but he denies actual chest pain.  He denies nausea, vomiting, urinary symptoms or GI symptoms.  No fevers or chills.  No production of a cough.  No sick contacts to his knowledge.  He reports that previously he had been given inhaler that helped with his breathing but has never continued that.  On exam, lungs had some wheezing but otherwise had no rhonchi or rales.  Chest was slightly tender to palpation and reproduce the tightness.  His abdomen was nontender.  Good pulses in extremities.  No leg pain or leg swelling or edema seen on exam.  EKG showed no STEMI.  Based on his description of symptoms, I am primarily concerned about likely undiagnosed reactive airway disease being exacerbated by the particular cardboard, allergens with pollen, smoking, and the environmental temperature changes worsening his symptoms.  I have lower suspicion for a cardiac cause.  Patient had laboratory testing flu delta troponin was negative.  Chest x-ray shows no pneumonia.  Other work-up  reassuring.  Due to the shortness of breath and Covid pandemic, he would like a coronavirus test.  This will be ordered for him to follow-up on the results as an outpatient.  Patient given inhaler with several puffs and he felt much better.  Patient given the inhaler and will also be given prescription for a burst of steroids to help with likely reactive airway disease.  Patient will follow up with a PCP for further work-up and management.  We feel he safe for discharge home and patient agreed.  Patient discharged in good condition with understanding of return precautions and follow-up instructions.   Final Clinical Impression(s) / ED Diagnoses Final diagnoses:  Chest tightness  Shortness of breath  Wheezing    Rx / DC Orders ED Discharge Orders         Ordered    predniSONE (DELTASONE) 50 MG tablet  Daily     Discontinue  Reprint     09/05/19 2203         Clinical Impression: 1. Chest tightness   2. Shortness of breath   3. Wheezing     Disposition: Discharge  Condition: Good  I have discussed the results, Dx and Tx plan with the pt(& family if present). He/she/they expressed understanding and agree(s) with the plan. Discharge instructions discussed at great length. Strict return precautions discussed and pt &/or family have verbalized understanding of the instructions. No further questions at time of discharge.    Discharge Medication List as of 09/05/2019 10:04 PM    START taking these medications   Details  !! predniSONE (DELTASONE) 50 MG tablet Take 1 tablet (50 mg total) by mouth daily for 5 days., Starting Wed 09/05/2019, Until Mon 09/10/2019, Print     !! - Potential duplicate medications found. Please discuss with provider.      Follow Up: [facility] [address] [place] [address] [phone] Schedule an appointment as soon as possible for a visit    [facility] [place] [facility] [address] [id number] [address] [place] [address] [phone]       Makyra Corprew, Canary Brim, MD 09/05/19 2312

## 2019-10-28 ENCOUNTER — Emergency Department (HOSPITAL_BASED_OUTPATIENT_CLINIC_OR_DEPARTMENT_OTHER)
Admission: EM | Admit: 2019-10-28 | Discharge: 2019-10-28 | Disposition: A | Discharge status: Home / Self Care | Payer: Self-pay | Attending: Emergency Medicine | Admitting: Emergency Medicine

## 2019-10-28 ENCOUNTER — Encounter (HOSPITAL_BASED_OUTPATIENT_CLINIC_OR_DEPARTMENT_OTHER): Payer: Self-pay | Admitting: Emergency Medicine

## 2019-10-28 ENCOUNTER — Other Ambulatory Visit: Payer: Self-pay

## 2019-10-28 DIAGNOSIS — F17210 Nicotine dependence, cigarettes, uncomplicated: Secondary | ICD-10-CM | POA: Insufficient documentation

## 2019-10-28 DIAGNOSIS — Z202 Contact with and (suspected) exposure to infections with a predominantly sexual mode of transmission: Secondary | ICD-10-CM | POA: Insufficient documentation

## 2019-10-28 DIAGNOSIS — Z113 Encounter for screening for infections with a predominantly sexual mode of transmission: Secondary | ICD-10-CM

## 2019-10-28 NOTE — ED Notes (Signed)
Pt discharged to home. Discharge instructions have been discussed with patient and/or family members. Pt verbally acknowledges understanding d/c instructions, and endorses comprehension to checkout at registration before leaving.  °

## 2019-10-28 NOTE — ED Provider Notes (Signed)
[facility] EMERGENCY DEPARTMENT Provider Note   CSN: [id number] Arrival date & time: 10/28/19  1255     History Chief Complaint  Patient presents with   Exposure to STD    [facility] is a 34 y.o. male.  Patient presents with concern over STD exposure.  Patient states that he was told that he was exposed to somebody with herpes and he needs to be tested.  He does not know if the person had an active rash.  He is completely asymptomatic.  No skin rashes or lesions.  No penile discharge or dysuria.  No treatments prior to arrival.        Past Medical History:  Diagnosis Date   Bronchitis     There are no problems to display for this patient.   History reviewed. No pertinent surgical history.     No family history on file.  Social History   Tobacco Use   Smoking status: Current Every Day Smoker    Types: Cigarettes   Smokeless tobacco: Never Used  Vaping Use   Vaping Use: Never used  Substance Use Topics   Alcohol use: Yes    Comment: weekly   Drug use: No    Home Medications Prior to Admission medications   Medication Sig Start Date End Date Taking? Authorizing Provider  doxycycline (VIBRAMYCIN) 100 MG capsule Take 1 capsule (100 mg total) by mouth 2 (two) times daily. 06/13/17   Petrucelli, Samantha R, PA-C  ibuprofen (ADVIL,MOTRIN) 800 MG tablet Take 1 tablet (800 mg total) by mouth every 8 (eight) hours as needed for moderate pain. 10/19/17   Terrilee Files, MD  naproxen (NAPROSYN) 500 MG tablet Take 1 tablet (500 mg total) by mouth 2 (two) times daily. 11/22/17   Vanetta Mulders, MD  predniSONE (DELTASONE) 20 MG tablet Take 2 tablets (40 mg total) by mouth daily. 04/16/15   Pisciotta, Joni Reining, PA-C    Allergies    Patient has no known allergies.  Review of Systems   Review of Systems  Constitutional: Negative for fever.  HENT: Negative for sore throat.   Eyes: Negative for discharge.  Gastrointestinal: Negative for rectal pain.    Genitourinary: Negative for discharge, dysuria, frequency, genital sores, penile pain and testicular pain.  Musculoskeletal: Negative for arthralgias.  Skin: Negative for rash.  Hematological: Negative for adenopathy.    Physical Exam Updated Vital Signs BP (!) 148/95 (BP Location: Left Arm)    Pulse 82    Temp 99.8 F (37.7 C) (Oral)    Resp 18    Ht 5\' 7"  (1.702 m)    Wt 81.6 kg    SpO2 99%    BMI 28.19 kg/m   Physical Exam Vitals and nursing note reviewed.  Constitutional:      Appearance: He is well-developed.  HENT:     Head: Normocephalic and atraumatic.  Eyes:     Conjunctiva/sclera: Conjunctivae normal.  Pulmonary:     Effort: No respiratory distress.  Musculoskeletal:     Cervical back: Normal range of motion and neck supple.  Skin:    General: Skin is warm and dry.  Neurological:     Mental Status: He is alert.     ED Results / Procedures / Treatments   Labs (all labs ordered are listed, but only abnormal results are displayed) Labs Reviewed  HSV 1 ANTIBODY, IGG  HSV 2 ANTIBODY, IGG  GC/CHLAMYDIA PROBE AMP ([place]) NOT AT [facility]    EKG None  Radiology No results found.  Procedures Procedures (including critical care time)  Medications Ordered in ED Medications - No data to display  ED Course  I have reviewed the triage vital signs and the nursing notes.  Pertinent labs & imaging results that were available during my care of the patient were reviewed by me and considered in my medical decision making (see chart for details).  Patient seen and examined.  Patient educated and counseled on transmission of herpes simplex and limitations of blood testing.  Discussed that this simply means that he has only been exposed at some time in the past.  He denies ever having cold sores or rash that is suggestive of HSV.  Vital signs reviewed and are as follows: BP (!) 148/95 (BP Location: Left Arm)    Pulse 82    Temp 99.8 F (37.7 C) (Oral)    Resp 18     Ht 5\' 7"  (1.702 m)    Wt 81.6 kg    SpO2 99%    BMI 28.19 kg/m   Testing will be ordered.  UA for gonorrhea and chlamydia.  Encouraged follow-up as needed.    MDM Rules/Calculators/A&P                          Testing as above.  Patient is asymptomatic and does not require treatment.  Final Clinical Impression(s) / ED Diagnoses Final diagnoses:  STD exposure  Screen for STD (sexually transmitted disease)    Rx / DC Orders ED Discharge Orders    None       , PA-C 10/28/19 1426    12/28/19, MD 10/28/19 [phone]

## 2019-10-28 NOTE — Discharge Instructions (Signed)
Please read and follow all provided instructions.  Your diagnoses today include:  1. STD exposure   2. Screen for STD (sexually transmitted disease)     Tests performed today include:  Test for gonorrhea and chlamydia.   Blood testing for herpes antibodies  Vital signs. See below for your results today.   Medications:   None  Home care instructions:  Read educational materials contained in this packet and follow any instructions provided.   You should tell your partners about your infection and avoid having sex for one week to allow time for the medicine to work.  Sexually transmitted disease testing also available at:   [facility] [place], [facility]  [address], [place], phone [phone] or [phone]    Monday - Friday, call for an appointment  Return instructions:   Please return to the Emergency Department if you experience worsening symptoms.   Please return if you have any other emergent concerns.  Additional Information:  Your vital signs today were: BP (!) 148/95 (BP Location: Left Arm)    Pulse 82    Temp 99.8 F (37.7 C) (Oral)    Resp 18    Ht 5\' 7"  (1.702 m)    Wt 81.6 kg    SpO2 99%    BMI 28.19 kg/m  If your blood pressure (BP) was elevated above 135/85 this visit, please have this repeated by your doctor within one month. --------------

## 2019-10-28 NOTE — ED Triage Notes (Signed)
Pt here for STD exposure to herpes. Pt denies symptoms.

## 2019-10-28 NOTE — ED Notes (Signed)
Pt ambulatory with steady gait to restroom to provide urine specimen 

## 2019-10-29 LAB — GC/CHLAMYDIA PROBE AMP (~~LOC~~) NOT AT ARMC
Chlamydia: NEGATIVE
Comment: NEGATIVE
Comment: NORMAL
Neisseria Gonorrhea: NEGATIVE

## 2019-10-30 LAB — HSV 1 ANTIBODY, IGG: HSV 1 Glycoprotein G Ab, IgG: <0.91 index (ref 0.00–0.90)

## 2019-10-30 LAB — HSV 2 ANTIBODY, IGG: HSV 2 Glycoprotein G Ab, IgG: <0.91 index (ref 0.00–0.90)

## 2020-06-20 ENCOUNTER — Emergency Department (HOSPITAL_BASED_OUTPATIENT_CLINIC_OR_DEPARTMENT_OTHER)
Admission: EM | Admit: 2020-06-20 | Discharge: 2020-06-20 | Disposition: A | Discharge status: Home / Self Care | Payer: 59 | Attending: Emergency Medicine | Admitting: Emergency Medicine

## 2020-06-20 ENCOUNTER — Other Ambulatory Visit: Payer: Self-pay

## 2020-06-20 ENCOUNTER — Emergency Department (HOSPITAL_BASED_OUTPATIENT_CLINIC_OR_DEPARTMENT_OTHER): Payer: 59

## 2020-06-20 ENCOUNTER — Encounter (HOSPITAL_BASED_OUTPATIENT_CLINIC_OR_DEPARTMENT_OTHER): Payer: Self-pay | Admitting: *Deleted

## 2020-06-20 DIAGNOSIS — R079 Chest pain, unspecified: Secondary | ICD-10-CM | POA: Insufficient documentation

## 2020-06-20 DIAGNOSIS — I10 Essential (primary) hypertension: Secondary | ICD-10-CM | POA: Diagnosis not present

## 2020-06-20 DIAGNOSIS — F17210 Nicotine dependence, cigarettes, uncomplicated: Secondary | ICD-10-CM | POA: Diagnosis not present

## 2020-06-20 LAB — BASIC METABOLIC PANEL
Anion gap: 8 (ref 5–15)
BUN: 13 mg/dL (ref 6–20)
CO2: 29 mmol/L (ref 22–32)
Calcium: 9.6 mg/dL (ref 8.9–10.3)
Chloride: 99 mmol/L (ref 98–111)
Creatinine, Ser: 1.04 mg/dL (ref 0.61–1.24)
GFR, Estimated: >60 mL/min (ref >60)
Glucose, Bld: 100 mg/dL — ABNORMAL HIGH (ref 70–99)
Potassium: 3.6 mmol/L (ref 3.5–5.1)
Sodium: 136 mmol/L (ref 135–145)

## 2020-06-20 LAB — CBC
HCT: 38.6 % — ABNORMAL LOW (ref 39.0–52.0)
Hemoglobin: 12.9 g/dL — ABNORMAL LOW (ref 13.0–17.0)
MCH: 27.7 pg (ref 26.0–34.0)
MCHC: 33.4 g/dL (ref 30.0–36.0)
MCV: 83 fL (ref 80.0–100.0)
Platelets: 276 10*3/uL (ref 150–400)
RBC: 4.65 MIL/uL (ref 4.22–5.81)
RDW: 13.9 % (ref 11.5–15.5)
WBC: 6.4 10*3/uL (ref 4.0–10.5)
nRBC: 0 % (ref 0.0–0.2)

## 2020-06-20 LAB — TROPONIN I (HIGH SENSITIVITY)
Troponin I (High Sensitivity): 7 ng/L (ref <18)
Troponin I (High Sensitivity): 8 ng/L (ref <18)

## 2020-06-20 LAB — CBG MONITORING, ED: Glucose-Capillary: 99 mg/dL (ref 70–99)

## 2020-06-20 MED ORDER — ASPIRIN 81 MG PO CHEW
324.0000 mg | CHEWABLE_TABLET | Freq: Once | ORAL | Status: AC
  Administered 2020-06-20: 324 mg via ORAL
  Filled 2020-06-20: qty 4

## 2020-06-20 NOTE — ED Notes (Signed)
Placed in exam room 5 upon arrival after triage, ECG completed and to EDP

## 2020-06-20 NOTE — ED Notes (Signed)
ED Provider at bedside. 

## 2020-06-20 NOTE — ED Notes (Signed)
Family member brought food for patient, Dr Lynelle Doctor approved.  Ambulated to bathroom with steady gait, denies chest pain.

## 2020-06-20 NOTE — ED Provider Notes (Signed)
[facility] EMERGENCY DEPARTMENT Provider Note   CSN: [id number] Arrival date & time: 06/20/20  1650     History Chief Complaint  [facility] presents with  . Chest Pain    [facility] is Trevor 35 y.o. male.  HPI  HPI: [facility] with Trevor history of hypertension presents for evaluation of chest pain. Initial onset of pain was more than 6 hours ago. The Gonzalez's chest pain is described as heaviness/pressure/tightness and is worse with exertion. The Gonzalez's chest pain is middle- or left-sided, is not well-localized, is not sharp and does not radiate to the arms/jaw/neck. The Gonzalez does not complain of nausea and denies diaphoresis. The Gonzalez has smoked in the past 90 days. The Gonzalez has no history of stroke, has no history of peripheral artery disease, denies any history of treated diabetes, has no relevant family history of coronary artery disease (first degree relative at less than age 35), has no history of hypercholesterolemia and does not have an elevated BMI (>=30).  [facility]onzalez states he was having some symptoms when he was at work the other day.  The symptoms resolved.  He was at work today when he noticed it again when he was lifting boxes.  Was initially more on the right side but also then moved to the center on the left. Past Medical History:  Diagnosis Date  . Bronchitis     There are no problems to display for this [facility].   History reviewed. No pertinent surgical history.     No family history on file.  Social History   Tobacco Use  . Smoking status: Current Every Day Smoker    Types: Cigarettes  . Smokeless tobacco: Never Used  Vaping Use  . Vaping Use: Never used  Substance Use Topics  . Alcohol use: Yes    Comment: weekly  . Drug use: No    Home Medications Prior to Admission medications   Not on File    Allergies    G[facility]alez has no known allergies.  Review of Systems   Review of Systems  All other systems reviewed and are  negative.   Physical Exam Updated Vital Signs BP (!) 126/91   Pulse 63   Temp 98.4 F (36.9 C) (Oral)   Resp 13   Ht 1.702 m (5\' 7" )   Wt 80.9 kg   SpO2 98%   BMI 27.93 kg/m   Physical Exam Vitals and nursing note reviewed.  Constitutional:      General: He is not in acute distress.    Appearance: He is well-developed.  HENT:     Head: Normocephalic and atraumatic.     Right Ear: External ear normal.     Left Ear: External ear normal.  Eyes:     General: No scleral icterus.       Right eye: No discharge.        Left eye: No discharge.     Conjunctiva/sclera: Conjunctivae normal.  Neck:     Trachea: No tracheal deviation.  Cardiovascular:     Rate and Rhythm: Normal rate and regular rhythm.  Pulmonary:     Effort: Pulmonary effort is normal. No respiratory distress.     Breath sounds: Normal breath sounds. No stridor. No wheezing or rales.  Abdominal:     General: Bowel sounds are normal. There is no distension.     Palpations: Abdomen is soft.     Tenderness: There is no abdominal tenderness. There is no guarding or  rebound.  Musculoskeletal:        General: No tenderness.     Cervical back: Neck supple.  Skin:    General: Skin is warm and dry.     Findings: No rash.  Neurological:     Mental Status: He is alert.     Cranial Nerves: No cranial nerve deficit (no facial droop, extraocular movements intact, no slurred speech).     Sensory: No sensory deficit.     Motor: No abnormal muscle tone or seizure activity.     Coordination: Coordination normal.     ED Results / Procedures / Treatments   Labs (all labs ordered are listed, but only abnormal results are displayed) Labs Reviewed  BASIC METABOLIC PANEL - Abnormal; Notable for the following components:      Result Value   Glucose, Bld 100 (*)    All other components within normal limits  CBC - Abnormal; Notable for the following components:   Hemoglobin 12.9 (*)    HCT 38.6 (*)    All other components  within normal limits  CBG MONITORING, ED  TROPONIN I (HIGH SENSITIVITY)  TROPONIN I (HIGH SENSITIVITY)    EKG EKG Interpretation  Date/Time:  Friday June 20 2020 17:01:03 EDT Ventricular Rate:  75 PR Interval:  148 QRS Duration: 94 QT Interval:  358 QTC Calculation: 399 R Axis:   -8 Text Interpretation: Normal sinus rhythm Normal ECG No significant change since last tracing Confirmed by Linwood Dibbles [phone]) on 06/20/2020 5:03:08 PM   Radiology DG Chest Portable 1 View  Result Date: 06/20/2020 CLINICAL DATA:  Shortness of breath and chest pain for 4 days, smoker EXAM: PORTABLE CHEST 1 VIEW COMPARISON:  Radiograph 09/05/2019 FINDINGS: No consolidation, features of edema, pneumothorax, or effusion. Pulmonary vascularity is normally distributed. The cardiomediastinal contours are unremarkable. No acute osseous or soft tissue abnormality. Telemetry leads overlie the chest. IMPRESSION: No acute cardiopulmonary abnormality. Electronically Signed   By: Kreg Shropshire M.D.   On: 06/20/2020 17:51    Procedures Procedures   Medications Ordered in ED Medications  aspirin chewable tablet 324 mg (324 mg Oral Given 06/20/20 1720)    ED Course  I have reviewed the triage vital signs and the nursing notes.  Pertinent labs & imaging results that were available during my care of the Gonzalez were reviewed by me and considered in my medical decision making (see chart for details).  Clinical Course as of 06/20/20 2015  Fri Jun 20, 2020  2005 Serial troponins are normal. [JK]    Clinical Course User Index [JK] Linwood Dibbles, MD   MDM Rules/Calculators/Trevor&P HEAR Score: 2                       Low risk for PE.  PERC negative no history of hypertension but the Gonzalez is hypertensive here in the emergency room.  Serial troponins negative.  Doubt symptoms are related to ACS.  Initially was hypertensive but his blood pressure has improved without medications.  At this point ED work-up is reassuring.   Doubt acute coronary syndrome pulmonary embolism dissection or other emergent etiology.  Recommend outpatient follow-up with primary care doctor or cardiology.  Consider stress testing. Final Clinical Impression(s) / ED Diagnoses Final diagnoses:  Chest pain, unspecified type    Rx / DC Orders ED Discharge Orders    None       Linwood Dibbles, MD 06/20/20 2016

## 2020-06-20 NOTE — ED Triage Notes (Signed)
Pain in his upper chest when he lifts his arms. Pain started at work today.

## 2020-06-20 NOTE — ED Notes (Signed)
2 day hx of rt sided chest pain, approx 1000hrs today pain became worse. Denies any SOB, Nausea, Vomiting. No HTN, DM, is a smoker.

## 2020-06-20 NOTE — Discharge Instructions (Signed)
Take over-the-counter medications as needed for pain.  Consider following up with a primary care doctor or cardiologist for further evaluation.  Return to the ED as needed for worsening or recurrent symptoms

## 2020-06-22 ENCOUNTER — Other Ambulatory Visit: Payer: Self-pay

## 2020-06-22 ENCOUNTER — Emergency Department (HOSPITAL_BASED_OUTPATIENT_CLINIC_OR_DEPARTMENT_OTHER)
Admission: EM | Admit: 2020-06-22 | Discharge: 2020-06-23 | Disposition: A | Discharge status: Home / Self Care | Payer: 59 | Attending: Emergency Medicine | Admitting: Emergency Medicine

## 2020-06-22 ENCOUNTER — Encounter (HOSPITAL_BASED_OUTPATIENT_CLINIC_OR_DEPARTMENT_OTHER): Payer: Self-pay | Admitting: Emergency Medicine

## 2020-06-22 DIAGNOSIS — R109 Unspecified abdominal pain: Secondary | ICD-10-CM | POA: Insufficient documentation

## 2020-06-22 DIAGNOSIS — R0789 Other chest pain: Secondary | ICD-10-CM

## 2020-06-22 DIAGNOSIS — R0602 Shortness of breath: Secondary | ICD-10-CM | POA: Diagnosis not present

## 2020-06-22 DIAGNOSIS — R0781 Pleurodynia: Secondary | ICD-10-CM | POA: Insufficient documentation

## 2020-06-22 DIAGNOSIS — F17210 Nicotine dependence, cigarettes, uncomplicated: Secondary | ICD-10-CM | POA: Diagnosis not present

## 2020-06-22 LAB — TROPONIN I (HIGH SENSITIVITY): Troponin I (High Sensitivity): 8 ng/L (ref <18)

## 2020-06-22 MED ORDER — CYCLOBENZAPRINE HCL 10 MG PO TABS
10.0000 mg | ORAL_TABLET | Freq: Two times a day (BID) | ORAL | 0 refills | Status: DC | PRN
Start: 2020-06-22 — End: 2023-11-29

## 2020-06-22 MED ORDER — OMEPRAZOLE 20 MG PO CPDR
20.0000 mg | DELAYED_RELEASE_CAPSULE | Freq: Every day | ORAL | 0 refills | Status: DC
Start: 2020-06-22 — End: 2023-11-29

## 2020-06-22 NOTE — ED Triage Notes (Signed)
Seen two days ago for the same.  Continues to have pain in upper abdomen/chest.  Reports numbness in arms and feels SOB at times.

## 2020-06-22 NOTE — Discharge Instructions (Addendum)
Your lab work and physical exam look reassuring.  I have given you a prescription for a muscle relaxer please take as prescribed.  Please beware this medication make you drowsy do not consume alcohol or operate heavy machinery while taking  this medication.  also started you on an acid pill please take as prescribed.  Given you the contact information for a primary care provider please call to schedule a follow-up appointment.   Come back to the emergency department if you develop chest pain, shortness of breath, severe abdominal pain, uncontrolled nausea, vomiting, diarrhea.

## 2020-06-22 NOTE — ED Provider Notes (Signed)
[facility] EMERGENCY DEPARTMENT Provider Note   CSN: [id number] Arrival date & time: 06/22/20  2003     History Chief Complaint  Patient presents with  . Abdominal Pain  . Chest Pain    Trevor Gonzalez is a 35 y.o. male.  HPI   Patient with significant medical history of hypertension presents with chief complaint of bilateral rib pain.  He endorses this has been going on for the last week, states the pain comes and goes, generally is worsened with movement, occasionally associate with shortness of breath, denies becoming diaphoretic, lightheaded or dizziness, nauseous or vomiting.  He has no cardiac history, no history of PEs or DVTs, currently not on hormone therapy.  Patient denies any alleviating factors, patient states he currently has no pain at this time, states pain has resolved.  Patient denies headaches, fevers, chills, shortness of breath, chest pain, abdominal pain, nausea, vomiting, diarrhea, worsening pedal edema.  After reviewing patient's chart patient was recently seen at [facility] 2 days ago for same complaint, patient had chest pain work-up, had reassuring lab work and imaging, EKG unremarkable.  Past Medical History:  Diagnosis Date  . Bronchitis     There are no problems to display for this patient.   History reviewed. No pertinent surgical history.     No family history on file.  Social History   Tobacco Use  . Smoking status: Current Every Day Smoker    Types: Cigarettes  . Smokeless tobacco: Never Used  Vaping Use  . Vaping Use: Never used  Substance Use Topics  . Alcohol use: Yes    Comment: weekly  . Drug use: No    Home Medications Prior to Admission medications   Medication Sig Start Date End Date Taking? Authorizing Provider  cyclobenzaprine (FLEXERIL) 10 MG tablet Take 1 tablet (10 mg total) by mouth 2 (two) times daily as needed for muscle spasms. 06/22/20  Yes Carroll Sage, PA-C  omeprazole (PRILOSEC) 20  MG capsule Take 1 capsule (20 mg total) by mouth daily. 06/22/20 07/22/20 Yes Carroll Sage, PA-C    Allergies    Patient has no known allergies.  Review of Systems   Review of Systems  Constitutional: Negative for chills and fever.  HENT: Negative for congestion.   Respiratory: Negative for shortness of breath.   Cardiovascular: Negative for chest pain.  Gastrointestinal: Negative for abdominal pain, diarrhea, nausea and vomiting.  Genitourinary: Negative for enuresis.  Musculoskeletal: Negative for back pain.  Skin: Negative for rash.  Neurological: Negative for dizziness and headaches.  Hematological: Does not bruise/bleed easily.    Physical Exam Updated Vital Signs BP 139/90   Pulse 65   Temp 98.2 F (36.8 C) (Oral)   Resp 18   Ht 5\' 7"  (1.702 m)   Wt 77.1 kg   SpO2 99%   BMI 26.63 kg/m   Physical Exam Vitals and nursing note reviewed.  Constitutional:      General: He is not in acute distress.    Appearance: He is not ill-appearing.  HENT:     Head: Normocephalic and atraumatic.     Nose: No congestion.  Eyes:     Conjunctiva/sclera: Conjunctivae normal.  Cardiovascular:     Rate and Rhythm: Normal rate and regular rhythm.     Pulses: Normal pulses.     Heart sounds: No murmur heard. No friction rub. No gallop.      Comments: Chest was palpated is nontender to palpation  Pulmonary:     Effort: No respiratory distress.     Breath sounds: No wheezing, rhonchi or rales.  Abdominal:     Palpations: Abdomen is soft.     Tenderness: There is no abdominal tenderness. There is no right CVA tenderness or left CVA tenderness.  Musculoskeletal:     Right lower leg: No edema.     Left lower leg: No edema.  Skin:    General: Skin is warm and dry.  Neurological:     Mental Status: He is alert.  Psychiatric:        Mood and Affect: Mood normal.     ED Results / Procedures / Treatments   Labs (all labs ordered are listed, but only abnormal results are  displayed) Labs Reviewed  TROPONIN I (HIGH SENSITIVITY)    EKG None  Radiology No results found.  Procedures Procedures   Medications Ordered in ED Medications - No data to display  ED Course  I have reviewed the triage vital signs and the nursing notes.  Pertinent labs & imaging results that were available during my care of the patient were reviewed by me and considered in my medical decision making (see chart for details).    MDM Rules/Calculators/A&P                         Initial impression-patient presents with lower chest pain x1 week.  He is alert, does not appear in acute distress, vital signs reassuring.  Triage obtained troponin and EKG.  Will defer further lab work or imaging as patient has no complaints at this time, lab work and imaging reviewed 2 days ago appear to be unremarkable.  Work-up-EKG sinus without signs of ischemia, troponin 8  Rule out- I have low suspicion for ACS as history is atypical, patient has no cardiac history, EKG was sinus rhythm without signs of ischemia, first troponin is 8 will defer second troponin as patient has had chest pain for greater than 12 hours, would expect elevation troponin if ACS was present.  Low suspicion for PE as patient denies pleuritic chest pain, shortness of breath, patient denies leg pain, no pedal edema noted on exam, patient was PERC negative.  Low suspicion for AAA or aortic dissection as history is atypical, patient has low risk factors.  Low suspicion for systemic infection as patient is nontoxic-appearing, vital signs reassuring, no obvious source infection noted on exam.  Plan-patient has chest pain with unknown etiology differential diagnosis includes costochondritis, muscle strain, GERD, cervical angina, anxiety.  I suspect he is suffering from muscular strain  will start him on Flexeril, also provide him with a PPI and follow-up with PCP for further evaluation.  Vital signs have remained stable, no indication  for hospital admission.  Patient given at home care as well strict return precautions.  Patient verbalized that they understood agreed to said plan.   Final Clinical Impression(s) / ED Diagnoses Final diagnoses:  Atypical chest pain    Rx / DC Orders ED Discharge Orders         Ordered    cyclobenzaprine (FLEXERIL) 10 MG tablet  2 times daily PRN        06/22/20 2308    omeprazole (PRILOSEC) 20 MG capsule  Daily        06/22/20 2308           Carroll Sage, PA-C 06/22/20 2321    Arby Barrette, MD 07/02/20 [phone]

## 2021-10-15 ENCOUNTER — Emergency Department (HOSPITAL_BASED_OUTPATIENT_CLINIC_OR_DEPARTMENT_OTHER)
Admission: EM | Admit: 2021-10-15 | Discharge: 2021-10-15 | Disposition: A | Discharge status: Home / Self Care | Payer: 59 | Attending: Emergency Medicine | Admitting: Emergency Medicine

## 2021-10-15 ENCOUNTER — Other Ambulatory Visit: Payer: Self-pay

## 2021-10-15 ENCOUNTER — Encounter (HOSPITAL_BASED_OUTPATIENT_CLINIC_OR_DEPARTMENT_OTHER): Payer: Self-pay | Admitting: Emergency Medicine

## 2021-10-15 DIAGNOSIS — F419 Anxiety disorder, unspecified: Secondary | ICD-10-CM | POA: Diagnosis not present

## 2021-10-15 DIAGNOSIS — R0602 Shortness of breath: Secondary | ICD-10-CM | POA: Diagnosis present

## 2021-10-15 DIAGNOSIS — F12129 Cannabis abuse with intoxication, unspecified: Secondary | ICD-10-CM | POA: Diagnosis not present

## 2021-10-15 DIAGNOSIS — F12920 Cannabis use, unspecified with intoxication, uncomplicated: Secondary | ICD-10-CM

## 2021-10-15 DIAGNOSIS — R000 Tachycardia, unspecified: Secondary | ICD-10-CM | POA: Diagnosis not present

## 2021-10-15 DIAGNOSIS — R002 Palpitations: Secondary | ICD-10-CM | POA: Insufficient documentation

## 2021-10-15 MED ORDER — LORAZEPAM 1 MG PO TABS
1.0000 mg | ORAL_TABLET | Freq: Once | ORAL | Status: AC
  Administered 2021-10-15: 1 mg via ORAL
  Filled 2021-10-15: qty 1

## 2021-10-15 NOTE — ED Provider Notes (Signed)
[facility] EMERGENCY DEPARTMENT Provider Note   CSN: [id number] Arrival date & time: 10/15/21  2001     History  Chief Complaint  Patient presents with   Shortness of Breath    Trevor Gonzalez is a 36 y.o. male.  36 yo M with a cc of sob, palpitations.  This occurred after taking an edible.  Was drinking too.  Feeling a bit anxious.    Shortness of Breath      Home Medications Prior to Admission medications   Medication Sig Start Date End Date Taking? Authorizing Provider  cyclobenzaprine (FLEXERIL) 10 MG tablet Take 1 tablet (10 mg total) by mouth 2 (two) times daily as needed for muscle spasms. 06/22/20   Carroll Sage, PA-C  omeprazole (PRILOSEC) 20 MG capsule Take 1 capsule (20 mg total) by mouth daily. 06/22/20 07/22/20  Carroll Sage, PA-C      Allergies    Patient has no known allergies.    Review of Systems   Review of Systems  Respiratory:  Positive for shortness of breath.     Physical Exam Updated Vital Signs BP 120/79   Pulse (!) 115   Temp 98.6 F (37 C) (Oral)   Resp 16   Ht 5\' 8"  (1.727 m)   Wt 86.2 kg   SpO2 95%   BMI 28.89 kg/m  Physical Exam Vitals and nursing note reviewed.  Constitutional:      Appearance: He is well-developed.  HENT:     Head: Normocephalic and atraumatic.  Eyes:     Pupils: Pupils are equal, round, and reactive to light.  Neck:     Vascular: No JVD.  Cardiovascular:     Rate and Rhythm: Regular rhythm. Tachycardia present.     Heart sounds: No murmur heard.    No friction rub. No gallop.  Pulmonary:     Effort: No respiratory distress.     Breath sounds: No wheezing.  Abdominal:     General: There is no distension.     Tenderness: There is no abdominal tenderness. There is no guarding or rebound.  Musculoskeletal:        General: Normal range of motion.     Cervical back: Normal range of motion and neck supple.  Skin:    Coloration: Skin is not pale.     Findings: No rash.   Neurological:     Mental Status: He is alert and oriented to person, place, and time.  Psychiatric:        Behavior: Behavior normal.     ED Results / Procedures / Treatments   Labs (all labs ordered are listed, but only abnormal results are displayed) Labs Reviewed - No data to display  EKG EKG Interpretation  Date/Time:  Thursday October 15 2021 20:22:20 EDT Ventricular Rate:  134 PR Interval:  154 QRS Duration: 86 QT Interval:  294 QTC Calculation: 439 R Axis:   0 Text Interpretation: Sinus tachycardia LAE, consider biatrial enlargement Borderline T abnormalities, lateral leads Since last tracing rate faster Otherwise no significant change Confirmed by [id number] [phone]) on 10/15/2021 8:28:07 PM  Radiology No results found.  Procedures Procedures    Medications Ordered in ED Medications  LORazepam (ATIVAN) tablet 1 mg (1 mg Oral Given 10/15/21 2040)    ED Course/ Medical Decision Making/ A&P                           Medical Decision  Making Risk Prescription drug management.   36 yo M with a chief complaints of ingesting an illegal drug.  This is made him feel anxious today.  His heart is going about 140 bpm on exam.  He otherwise is well-appearing nontoxic.  Will observe in the ED.  Patient's heart rate is improved significantly.  He is feeling better would like to go home.  10:41 PM:  I have discussed the diagnosis/risks/treatment options with the patient.  Evaluation and diagnostic testing in the emergency department does not suggest an emergent condition requiring admission or immediate intervention beyond what has been performed at this time.  They will follow up with  PCP. We also discussed returning to the ED immediately if new or worsening sx occur. We discussed the sx which are most concerning (e.g., sudden worsening pain, fever, inability to tolerate by mouth) that necessitate immediate return. Medications administered to the patient during their visit and  any new prescriptions provided to the patient are listed below.  Medications given during this visit Medications  LORazepam (ATIVAN) tablet 1 mg (1 mg Oral Given 10/15/21 2040)     The patient appears reasonably screen and/or stabilized for discharge and I doubt any other medical condition or other [facility] requiring further screening, evaluation, or treatment in the ED at this time prior to discharge.          Final Clinical Impression(s) / ED Diagnoses Final diagnoses:  Cannabis intoxication without complication [facility])    Rx / DC Orders ED Discharge Orders     None         Melene Plan, DO 10/15/21 2241

## 2021-10-15 NOTE — Discharge Instructions (Signed)
Your symptoms should progressively get better.  If tomorrow afternoon you are still feeling bad then let them know or come back to the ER if your symptoms acutely worsen.

## 2021-10-15 NOTE — ED Triage Notes (Signed)
Patient reports intermittent shortness of breath x4 years. 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

## 2022-06-19 ENCOUNTER — Encounter (HOSPITAL_COMMUNITY): Payer: Self-pay

## 2022-06-19 ENCOUNTER — Emergency Department (HOSPITAL_COMMUNITY)
Admission: EM | Admit: 2022-06-19 | Discharge: 2022-06-19 | Disposition: A | Discharge status: Home / Self Care | Payer: BC Managed Care – PPO | Attending: Emergency Medicine | Admitting: Emergency Medicine

## 2022-06-19 ENCOUNTER — Emergency Department (HOSPITAL_COMMUNITY): Payer: BC Managed Care – PPO

## 2022-06-19 DIAGNOSIS — E876 Hypokalemia: Secondary | ICD-10-CM | POA: Diagnosis not present

## 2022-06-19 DIAGNOSIS — R569 Unspecified convulsions: Secondary | ICD-10-CM

## 2022-06-19 DIAGNOSIS — R000 Tachycardia, unspecified: Secondary | ICD-10-CM | POA: Insufficient documentation

## 2022-06-19 LAB — CBC
HCT: 40.3 % (ref 39.0–52.0)
Hemoglobin: 12.7 g/dL — ABNORMAL LOW (ref 13.0–17.0)
MCH: 26.9 pg (ref 26.0–34.0)
MCHC: 31.5 g/dL (ref 30.0–36.0)
MCV: 85.4 fL (ref 80.0–100.0)
Platelets: 244 10*3/uL (ref 150–400)
RBC: 4.72 MIL/uL (ref 4.22–5.81)
RDW: 14.3 % (ref 11.5–15.5)
WBC: 7 10*3/uL (ref 4.0–10.5)
nRBC: 0 % (ref 0.0–0.2)

## 2022-06-19 LAB — URINALYSIS, ROUTINE W REFLEX MICROSCOPIC
Bilirubin Urine: NEGATIVE
Glucose, UA: NEGATIVE mg/dL
Ketones, ur: NEGATIVE mg/dL
Leukocytes,Ua: NEGATIVE
Nitrite: NEGATIVE
Protein, ur: NEGATIVE mg/dL
RBC / HPF: >50 RBC/hpf (ref 0–5)
Specific Gravity, Urine: 1.017 (ref 1.005–1.030)
pH: 5 (ref 5.0–8.0)

## 2022-06-19 LAB — RAPID URINE DRUG SCREEN, HOSP PERFORMED
Amphetamines: NOT DETECTED
Barbiturates: NOT DETECTED
Benzodiazepines: NOT DETECTED
Cocaine: NOT DETECTED
Opiates: NOT DETECTED
Tetrahydrocannabinol: NOT DETECTED

## 2022-06-19 LAB — BASIC METABOLIC PANEL
Anion gap: 9 (ref 5–15)
BUN: 23 mg/dL — ABNORMAL HIGH (ref 6–20)
CO2: 24 mmol/L (ref 22–32)
Calcium: 8.9 mg/dL (ref 8.9–10.3)
Chloride: 102 mmol/L (ref 98–111)
Creatinine, Ser: 1.13 mg/dL (ref 0.61–1.24)
GFR, Estimated: >60 mL/min (ref >60)
Glucose, Bld: 137 mg/dL — ABNORMAL HIGH (ref 70–99)
Potassium: 3.4 mmol/L — ABNORMAL LOW (ref 3.5–5.1)
Sodium: 135 mmol/L (ref 135–145)

## 2022-06-19 LAB — ETHANOL: Alcohol, Ethyl (B): <10 mg/dL (ref <10)

## 2022-06-19 LAB — CBG MONITORING, ED: Glucose-Capillary: 114 mg/dL — ABNORMAL HIGH (ref 70–99)

## 2022-06-19 LAB — MAGNESIUM: Magnesium: 1.9 mg/dL (ref 1.7–2.4)

## 2022-06-19 MED ORDER — POTASSIUM CHLORIDE CRYS ER 20 MEQ PO TBCR
40.0000 meq | EXTENDED_RELEASE_TABLET | Freq: Once | ORAL | Status: AC
  Administered 2022-06-19: 40 meq via ORAL
  Filled 2022-06-19: qty 2

## 2022-06-19 MED ORDER — LACTATED RINGERS IV BOLUS
1000.0000 mL | Freq: Once | INTRAVENOUS | Status: AC
  Administered 2022-06-19: 1000 mL via INTRAVENOUS

## 2022-06-19 NOTE — ED Notes (Signed)
Attempted CBG on all 5 fingers pt didn't bleed

## 2022-06-19 NOTE — Discharge Instructions (Signed)
Your presentation today is concerning for a possible seizure.You need to follow-up with neurology for further evaluation.   - According to [place] law, you can not drive unless you are seizure / syncope free for at least 6 months and under physician's care.    - Please maintain precautions. Do not participate in activities where a loss of awareness could harm you or someone else. No swimming alone, no tub bathing, no hot tubs, no driving, no operating motorized vehicles (cars, ATVs, motocycles, etc), lawnmowers, power tools or firearms. No standing at heights, such as rooftops, ladders or stairs. Avoid hot objects such as stoves, heaters, open fires. Wear a helmet when riding a bicycle, scooter, skateboard, etc. and avoid areas of traffic. Set your water heater to 120 degrees or less.

## 2022-06-19 NOTE — ED Provider Triage Note (Signed)
Emergency Medicine Provider Triage Evaluation Note  Trevor Gonzalez , a 37 y.o. male  was evaluated in triage.  Pt complains of syncopal episode. Was at the bar, had a double shot and a beer, then used a delta 8 vape pen, which he reports having used before. Apparently lost consciousness for about 10 sec per bystanders. Woke up and then passed out again, thinks he hit his head. States his tongue feels sore. Denies any hx of cardiac disease.  Review of Systems  Positive: Syncope, headache, tongue pain Negative:   Physical Exam  BP (!) 152/84 (BP Location: Right Arm)   Pulse (!) 114   Temp 98.1 F (36.7 C) (Oral)   Resp 18   Ht 5\' 8"  (1.727 m)   Wt 86.2 kg   SpO2 100%   BMI 28.89 kg/m  Gen:   Awake, no distress   Resp:  Normal effort  MSK:   Moves extremities without difficulty  Other:  A&O x 4  Medical Decision Making  Medically screening exam initiated at 6:49 PM.  Appropriate orders placed.  Trevor Gonzalez was informed that the remainder of the evaluation will be completed by another provider, this initial triage assessment does not replace that evaluation, and the importance of remaining in the ED until their evaluation is complete.  Workup initiated   Estill Cotta 06/19/22 1854

## 2022-06-19 NOTE — ED Triage Notes (Signed)
Pt BIBA from the bar. Pt had a double shot and a beer, along with a delta 8 vape pen. Pt was out for 10 seconds. EKG with EMS with WNL.

## 2022-06-19 NOTE — ED Notes (Signed)
Mother's cell phone [phone].Call with updates

## 2022-06-19 NOTE — ED Provider Notes (Signed)
[place] [facility] Provider Note   CSN: [id number] Arrival date & time: 06/19/22  1827     History  Chief Complaint  Patient presents with   Loss of Consciousness    Trevor Gonzalez is a 37 y.o. male.  HPI 37 year old male presents with syncope versus seizure.  Patient states that he was in a bar and needed a double shot of alcohol and then used a delta 8 vape pen.  He also thinks he had a beer.  Shortly thereafter he started feeling bad and then passed out.  He states that a bystander told him he was shaking like a seizure.  However the report from triage was that he was out for about 10 seconds.  He thinks he bit his tongue.  He is never had a seizure before.  Patient denies a headache, chest pain, shortness of breath or any new injury.  He does tell me has been dealing with intermittent lightheadedness and leg cramps for about a month.  These are not consistent and not currently present.  He denies any illicit drug use besides marijuana.  Home Medications Prior to Admission medications   Medication Sig Start Date End Date Taking? Authorizing Provider  cyclobenzaprine (FLEXERIL) 10 MG tablet Take 1 tablet (10 mg total) by mouth 2 (two) times daily as needed for muscle spasms. 06/22/20   Marcello Fennel, PA-C  omeprazole (PRILOSEC) 20 MG capsule Take 1 capsule (20 mg total) by mouth daily. 06/22/20 07/22/20  Marcello Fennel, PA-C      Allergies    Patient has no known allergies.    Review of Systems   Review of Systems  Constitutional:  Negative for fever.  Respiratory:  Negative for shortness of breath.   Cardiovascular:  Negative for chest pain.  Musculoskeletal:  Negative for neck pain.  Neurological:  Positive for syncope and light-headedness. Negative for weakness, numbness and headaches.    Physical Exam Updated Vital Signs BP 134/81   Pulse 88   Temp 98.1 F (36.7 C) (Oral)   Resp 18   Ht 5\' 8"  (1.727 m)   Wt 86.2 kg    SpO2 95%   BMI 28.89 kg/m  Physical Exam Vitals and nursing note reviewed.  Constitutional:      Appearance: He is well-developed. He is not diaphoretic.  HENT:     Head: Normocephalic.     Mouth/Throat:     Comments: Patient has a distal, small, midline tongue laceration Cardiovascular:     Rate and Rhythm: Regular rhythm. Tachycardia present.     Heart sounds: Normal heart sounds.  Pulmonary:     Effort: Pulmonary effort is normal.     Breath sounds: Normal breath sounds.  Abdominal:     Palpations: Abdomen is soft.     Tenderness: There is no abdominal tenderness.  Skin:    General: Skin is warm and dry.  Neurological:     Mental Status: He is alert and oriented to person, place, and time.     Comments: CN 3-12 grossly intact. 5/5 strength in all 4 extremities. Grossly normal sensation. Normal finger to nose.      ED Results / Procedures / Treatments   Labs (all labs ordered are listed, but only abnormal results are displayed) Labs Reviewed  BASIC METABOLIC PANEL - Abnormal; Notable for the following components:      Result Value   Potassium 3.4 (*)    Glucose, Bld 137 (*)  BUN 23 (*)    All other components within normal limits  CBC - Abnormal; Notable for the following components:   Hemoglobin 12.7 (*)    All other components within normal limits  URINALYSIS, ROUTINE W REFLEX MICROSCOPIC - Abnormal; Notable for the following components:   Hgb urine dipstick LARGE (*)    Bacteria, UA RARE (*)    All other components within normal limits  CBG MONITORING, ED - Abnormal; Notable for the following components:   Glucose-Capillary 114 (*)    All other components within normal limits  RAPID URINE DRUG SCREEN, HOSP PERFORMED  MAGNESIUM  ETHANOL    EKG EKG Interpretation  Date/Time:  Saturday June 19 2022 18:48:27 EDT Ventricular Rate:  110 PR Interval:  147 QRS Duration: 85 QT Interval:  307 QTC Calculation: 416 R Axis:   -25 Text Interpretation: Sinus  tachycardia LAE, consider biatrial enlargement Probable left ventricular hypertrophy ST elev, probable normal early repol pattern Confirmed by Sherwood Gambler [phone]) on 06/19/2022 8:06:32 PM  Radiology CT Head Wo Contrast  Result Date: 06/19/2022 CLINICAL DATA:  Syncope EXAM: CT HEAD WITHOUT CONTRAST TECHNIQUE: Contiguous axial images were obtained from the base of the skull through the vertex without intravenous contrast. RADIATION DOSE REDUCTION: This exam was performed according to the departmental dose-optimization program which includes automated exposure control, adjustment of the mA and/or kV according to patient size and/or use of iterative reconstruction technique. COMPARISON:  None Available. FINDINGS: Brain: There is no mass, hemorrhage or extra-axial collection. The size and configuration of the ventricles and extra-axial CSF spaces are normal. The brain parenchyma is normal, without acute or chronic infarction. Vascular: No abnormal hyperdensity of the major intracranial arteries or dural venous sinuses. No intracranial atherosclerosis. Skull: The visualized skull base, calvarium and extracranial soft tissues are normal. Sinuses/Orbits: Severe pansinusitis.  The orbits are normal. IMPRESSION: 1. No acute intracranial abnormality. 2. Severe pansinusitis. Electronically Signed   By: Ulyses Jarred M.D.   On: 06/19/2022 20:07    Procedures Procedures    Medications Ordered in ED Medications  lactated ringers bolus 1,000 mL (0 mLs Intravenous Stopped 06/19/22 2139)  potassium chloride SA (KLOR-CON M) CR tablet 40 mEq (40 mEq Oral Given 06/19/22 2057)    ED Course/ Medical Decision Making/ A&P                             Medical Decision Making Amount and/or Complexity of Data Reviewed Labs: ordered.    Details: Normal WBC, slight hypokalemia but otherwise electrolytes unremarkable.  UDS negative. Radiology: independent interpretation performed.    Details: No head bleed ECG/medicine  tests: ordered and independent interpretation performed.    Details: Sinus cardia  Risk Prescription drug management.   Syncope versus seizure.  He states that a bystander told him he had a seizure although the report on triage was that he had about a 10-second episode.  He did bite his tongue though it is midline.  He does not have any obvious need for antiepileptics at this time but I think he should at least follow-up with neurology and discussed this with him.  Also counseled on no driving until cleared.  He was given IV fluids and with time and fluids his heart rate and other vitals have normalized.  Labs are reassuring.  CT head for first-time seizure is unremarkable.  He is not altered.  Will discharge home with return precautions.  Final Clinical Impression(s) / ED Diagnoses Final diagnoses:  Seizure-like activity ([place])    Rx / DC Orders ED Discharge Orders          Ordered    Ambulatory referral to Neurology       Comments: An appointment is requested in approximately: 2 weeks   06/19/22 2240              Sherwood Gambler, MD 06/19/22 [phone]

## 2022-11-21 ENCOUNTER — Encounter (HOSPITAL_BASED_OUTPATIENT_CLINIC_OR_DEPARTMENT_OTHER): Payer: Self-pay

## 2022-11-21 ENCOUNTER — Emergency Department (HOSPITAL_BASED_OUTPATIENT_CLINIC_OR_DEPARTMENT_OTHER)
Admission: EM | Admit: 2022-11-21 | Discharge: 2022-11-21 | Disposition: A | Discharge status: Home / Self Care | Payer: BC Managed Care – PPO | Source: Home / Self Care | Attending: Emergency Medicine | Admitting: Emergency Medicine

## 2022-11-21 ENCOUNTER — Other Ambulatory Visit: Payer: Self-pay

## 2022-11-21 ENCOUNTER — Emergency Department (HOSPITAL_BASED_OUTPATIENT_CLINIC_OR_DEPARTMENT_OTHER): Payer: BC Managed Care – PPO

## 2022-11-21 DIAGNOSIS — J189 Pneumonia, unspecified organism: Secondary | ICD-10-CM

## 2022-11-21 DIAGNOSIS — F17210 Nicotine dependence, cigarettes, uncomplicated: Secondary | ICD-10-CM | POA: Diagnosis not present

## 2022-11-21 DIAGNOSIS — J45909 Unspecified asthma, uncomplicated: Secondary | ICD-10-CM | POA: Insufficient documentation

## 2022-11-21 DIAGNOSIS — J181 Lobar pneumonia, unspecified organism: Secondary | ICD-10-CM | POA: Diagnosis not present

## 2022-11-21 DIAGNOSIS — Z1152 Encounter for screening for COVID-19: Secondary | ICD-10-CM | POA: Insufficient documentation

## 2022-11-21 DIAGNOSIS — R059 Cough, unspecified: Secondary | ICD-10-CM | POA: Diagnosis present

## 2022-11-21 HISTORY — DX: Unspecified asthma, uncomplicated: J45.909

## 2022-11-21 LAB — RESP PANEL BY RT-PCR (RSV, FLU A&B, COVID)  RVPGX2
Influenza A by PCR: NEGATIVE
Influenza B by PCR: NEGATIVE
Resp Syncytial Virus by PCR: NEGATIVE
SARS Coronavirus 2 by RT PCR: NEGATIVE

## 2022-11-21 MED ORDER — IPRATROPIUM-ALBUTEROL 0.5-2.5 (3) MG/3ML IN SOLN
3.0000 mL | Freq: Once | RESPIRATORY_TRACT | Status: AC
  Administered 2022-11-21: 3 mL via RESPIRATORY_TRACT
  Filled 2022-11-21: qty 3

## 2022-11-21 MED ORDER — ALBUTEROL SULFATE HFA 108 (90 BASE) MCG/ACT IN AERS: 2.0000 | INHALATION_SPRAY | RESPIRATORY_TRACT | Status: DC | PRN

## 2022-11-21 MED ORDER — AMOXICILLIN-POT CLAVULANATE 875-125 MG PO TABS
1.0000 | ORAL_TABLET | Freq: Once | ORAL | Status: AC
  Administered 2022-11-21: 1 via ORAL
  Filled 2022-11-21: qty 1

## 2022-11-21 MED ORDER — DOXYCYCLINE HYCLATE 100 MG PO TABS
100.0000 mg | ORAL_TABLET | Freq: Once | ORAL | Status: AC
  Administered 2022-11-21: 100 mg via ORAL
  Filled 2022-11-21: qty 1

## 2022-11-21 MED ORDER — AMOXICILLIN-POT CLAVULANATE 875-125 MG PO TABS: 1.0000 | ORAL_TABLET | Freq: Two times a day (BID) | ORAL | 0 refills | Status: AC

## 2022-11-21 MED ORDER — METHYLPREDNISOLONE SODIUM SUCC 125 MG IJ SOLR
125.0000 mg | Freq: Once | INTRAMUSCULAR | Status: AC
  Administered 2022-11-21: 125 mg via INTRAMUSCULAR
  Filled 2022-11-21: qty 2

## 2022-11-21 MED ORDER — DOXYCYCLINE HYCLATE 100 MG PO CAPS: 100.0000 mg | ORAL_CAPSULE | Freq: Two times a day (BID) | ORAL | 0 refills | Status: AC

## 2022-11-21 NOTE — ED Provider Notes (Signed)
[place] [facility] Provider Note  CSN: [id number] Arrival date & time: 11/21/22 2595  Chief Complaint(s) Shortness of Breath and Cough  HPI Trevor Gonzalez is a 37 y.o. male with history of asthma presenting to the emergency department shortness of breath.  Patient reports worsening cough since yesterday, productive of some phlegm.  No fevers.  Has been using his albuterol inhaler without significant improvement.  Reports some congestion although this has been chronic.  Some chest pain with coughing.  No pleuritic pain.  Went to outside hospital yesterday and received breathing treatments which helped some but symptoms have returned.   Past Medical History Past Medical History:  Diagnosis Date   Asthma    Bronchitis    There are no problems to display for this patient.  Home Medication(s) Prior to Admission medications   Medication Sig Start Date End Date Taking? Authorizing Provider  amoxicillin-clavulanate (AUGMENTIN) 875-125 MG tablet Take 1 tablet by mouth every 12 (twelve) hours for 7 days. 11/21/22 11/28/22 Yes Lonell Grandchild, MD  doxycycline (VIBRAMYCIN) 100 MG capsule Take 1 capsule (100 mg total) by mouth 2 (two) times daily for 7 days. 11/21/22 11/28/22 Yes Lonell Grandchild, MD  cyclobenzaprine (FLEXERIL) 10 MG tablet Take 1 tablet (10 mg total) by mouth 2 (two) times daily as needed for muscle spasms. 06/22/20   Carroll Sage, PA-C  omeprazole (PRILOSEC) 20 MG capsule Take 1 capsule (20 mg total) by mouth daily. 06/22/20 07/22/20  Carroll Sage, PA-C                                                                                                                                    Past Surgical History History reviewed. No pertinent surgical history. Family History History reviewed. No pertinent family history.  Social History Social History   Tobacco Use   Smoking status: Every Day    Types: Cigarettes   Smokeless tobacco:  Never  Vaping Use   Vaping status: Never Used  Substance Use Topics   Alcohol use: Yes    Comment: weekly   Drug use: No   Allergies Patient has no known allergies.  Review of Systems Review of Systems  All other systems reviewed and are negative.   Physical Exam Vital Signs  I have reviewed the triage vital signs BP (!) 135/93   Pulse 69   Temp 99.7 F (37.6 C) (Oral)   Resp 20   Ht 5\' 8"  (1.727 m)   Wt 72.6 kg   SpO2 100%   BMI 24.33 kg/m  Physical Exam Vitals and nursing note reviewed.  Constitutional:      General: He is not in acute distress.    Appearance: Normal appearance.  HENT:     Mouth/Throat:     Mouth: Mucous membranes are moist.  Eyes:     Conjunctiva/sclera: Conjunctivae normal.  Cardiovascular:  Rate and Rhythm: Normal rate and regular rhythm.  Pulmonary:     Effort: Pulmonary effort is normal. No respiratory distress.     Breath sounds: Examination of the right-upper field reveals wheezing. Examination of the left-upper field reveals wheezing. Examination of the right-middle field reveals wheezing. Examination of the left-middle field reveals wheezing. Examination of the right-lower field reveals wheezing. Examination of the left-lower field reveals decreased breath sounds and rales. Decreased breath sounds, wheezing and rales present.  Abdominal:     General: Abdomen is flat.     Palpations: Abdomen is soft.     Tenderness: There is no abdominal tenderness.  Musculoskeletal:     Right lower leg: No edema.     Left lower leg: No edema.  Skin:    General: Skin is warm and dry.     Capillary Refill: Capillary refill takes less than 2 seconds.  Neurological:     Mental Status: He is alert and oriented to person, place, and time. Mental status is at baseline.  Psychiatric:        Mood and Affect: Mood normal.        Behavior: Behavior normal.     ED Results and Treatments Labs (all labs ordered are listed, but only abnormal results are  displayed) Labs Reviewed  RESP PANEL BY RT-PCR (RSV, FLU A&B, COVID)  RVPGX2                                                                                                                          Radiology DG Chest 2 View  Result Date: 11/21/2022 CLINICAL DATA:  Cough with shortness of breath. EXAM: CHEST - 2 VIEW COMPARISON:  Radiographs 11/20/2022 and 05/28/2022. FINDINGS: The heart size and mediastinal contours are stable. There is new streaky opacity at the left lung base, best seen on the frontal examination. The right lung is clear. There is no pleural effusion or pneumothorax. No acute osseous findings are evident. Telemetry leads overlie the chest. IMPRESSION: New streaky opacity at the left lung base suspicious for early pneumonia. Electronically Signed   By: Carey Bullocks M.D.   On: 11/21/2022 10:27    Pertinent labs & imaging results that were available during my care of the patient were reviewed by me and considered in my medical decision making (see MDM for details).  Medications Ordered in ED Medications  albuterol (VENTOLIN HFA) 108 (90 Base) MCG/ACT inhaler 2 puff (has no administration in time range)  ipratropium-albuterol (DUONEB) 0.5-2.5 (3) MG/3ML nebulizer solution 3 mL (3 mLs Nebulization Given 11/21/22 0903)  ipratropium-albuterol (DUONEB) 0.5-2.5 (3) MG/3ML nebulizer solution 3 mL (3 mLs Nebulization Given 11/21/22 0950)  methylPREDNISolone sodium succinate (SOLU-MEDROL) 125 mg/2 mL injection 125 mg (125 mg Intramuscular Given 11/21/22 0958)  amoxicillin-clavulanate (AUGMENTIN) 875-125 MG per tablet 1 tablet (1 tablet Oral Given 11/21/22 1048)  doxycycline (VIBRA-TABS) tablet 100 mg (100 mg Oral Given 11/21/22 1048)  Procedures Procedures  (including critical care time)  Medical Decision Making / ED Course   MDM:  37 year old male  presenting with shortness of breath and cough.  On exam, patient well-appearing, no respiratory distress or increased work of breathing.  Does have some diminished left basilar breath sounds and otherwise diffuse wheezing.  After second breathing treatment, wheezing mostly resolved.  X-ray performed given asymmetric breath sounds and does demonstrate left basilar opacity concerning for developing pneumonia.  He reports his symptoms are worse than typical asthma exacerbations suspect this is the ultimate cause.  He has some chest pain, EKG is reassuring, seems very musculoskeletal in nature and worse with coughing and movement.  X-ray with no pneumothorax.  Doubt other acute process such as pulmonary embolism, ACS, CHF given reassuring improvement with breathing treatment and alternative cause on x-ray. Will discharge patient to home. All questions answered. Patient comfortable with plan of discharge. Return precautions discussed with patient and specified on the after visit summary.       Additional history obtained: -Additional history obtained from family -External records from outside source obtained and reviewed including: Chart review including previous notes, labs, imaging, consultation notes including ER visit from yesterday   Lab Tests: -I ordered, reviewed, and interpreted labs.   The pertinent results include:   Labs Reviewed  RESP PANEL BY RT-PCR (RSV, FLU A&B, COVID)  RVPGX2    Notable for negative COVID  EKG   EKG Interpretation Date/Time:  Sunday November 21 2022 09:11:13 EDT Ventricular Rate:  88 PR Interval:  144 QRS Duration:  88 QT Interval:  361 QTC Calculation: 437 R Axis:   23  Text Interpretation: Sinus rhythm Biatrial enlargement Probable left ventricular hypertrophy Confirmed by Alvino Blood ([id number]) on 11/21/2022 9:55:22 AM         Imaging Studies ordered: I ordered imaging studies including CXR On my interpretation imaging demonstrates LLL PNA I  independently visualized and interpreted imaging. I agree with the radiologist interpretation   Medicines ordered and prescription drug management: Meds ordered this encounter  Medications   ipratropium-albuterol (DUONEB) 0.5-2.5 (3) MG/3ML nebulizer solution 3 mL   albuterol (VENTOLIN HFA) 108 (90 Base) MCG/ACT inhaler 2 puff   ipratropium-albuterol (DUONEB) 0.5-2.5 (3) MG/3ML nebulizer solution 3 mL   methylPREDNISolone sodium succinate (SOLU-MEDROL) 125 mg/2 mL injection 125 mg    IV methylprednisolone will be converted to either a q12h or q24h frequency with the same total daily dose (TDD).  Ordered Dose: 1 to 125 mg TDD; convert to: TDD q24h.  Ordered Dose: 126 to 250 mg TDD; convert to: TDD div q12h.  Ordered Dose: >250 mg TDD; DAW.   amoxicillin-clavulanate (AUGMENTIN) 875-125 MG per tablet 1 tablet   doxycycline (VIBRA-TABS) tablet 100 mg   amoxicillin-clavulanate (AUGMENTIN) 875-125 MG tablet    Sig: Take 1 tablet by mouth every 12 (twelve) hours for 7 days.    Dispense:  14 tablet    Refill:  0   doxycycline (VIBRAMYCIN) 100 MG capsule    Sig: Take 1 capsule (100 mg total) by mouth 2 (two) times daily for 7 days.    Dispense:  14 capsule    Refill:  0    -I have reviewed the patients home medicines and have made adjustments as needed  Cardiac Monitoring: The patient was maintained on a cardiac monitor.  I personally viewed and interpreted the cardiac monitored which showed an underlying rhythm of: sinus tachycardia -> NSR after treatment  Social Determinants of Health:  Diagnosis  or treatment significantly limited by social determinants of health: current smoker   Reevaluation: After the interventions noted above, I reevaluated the patient and found that their symptoms have improved  Co morbidities that complicate the patient evaluation  Past Medical History:  Diagnosis Date   Asthma    Bronchitis       Dispostion: Disposition decision including need for  hospitalization was considered, and patient discharged from emergency department.    Final Clinical Impression(s) / ED Diagnoses Final diagnoses:  Community acquired pneumonia of left lower lobe of lung     This chart was dictated using voice recognition software.  Despite best efforts to proofread,  errors can occur which can change the documentation meaning.    Lonell Grandchild, MD 11/21/22 1052

## 2022-11-21 NOTE — Discharge Instructions (Signed)
We evaluated you for your shortness of breath.  Your symptoms are likely due to a combination of pneumonia and asthma exacerbation.  Your symptoms improved with breathing treatments in the emergency department and your chest x-ray shows a pneumonia in your left lung.  I have prescribed antibiotics for your symptoms.  Please take these as prescribed.  Please also pick up the steroids you are prescribed yesterday at the other hospital and take these as prescribed starting tomorrow.  If you have any new or worsening symptoms such as increasing shortness of breath, severe chest pain, lightheadedness or dizziness, fainting, bleeding, or any other new symptoms, please return to the emergency department for reassessment.

## 2022-11-21 NOTE — ED Triage Notes (Signed)
The patient has cough, congestion and shortness of breath since yesterday. Patient has increased work of breathing in triage. RT at bedside

## 2023-02-05 IMAGING — DX DG CHEST 1V PORT
1 series · 1 of 1 positions shown · non-contrast
Comparison: Radiograph 09/05/2019

CLINICAL DATA: Shortness of breath and chest pain for 4 days,
smoker

EXAM:
PORTABLE CHEST 1 VIEW

[chest ap]
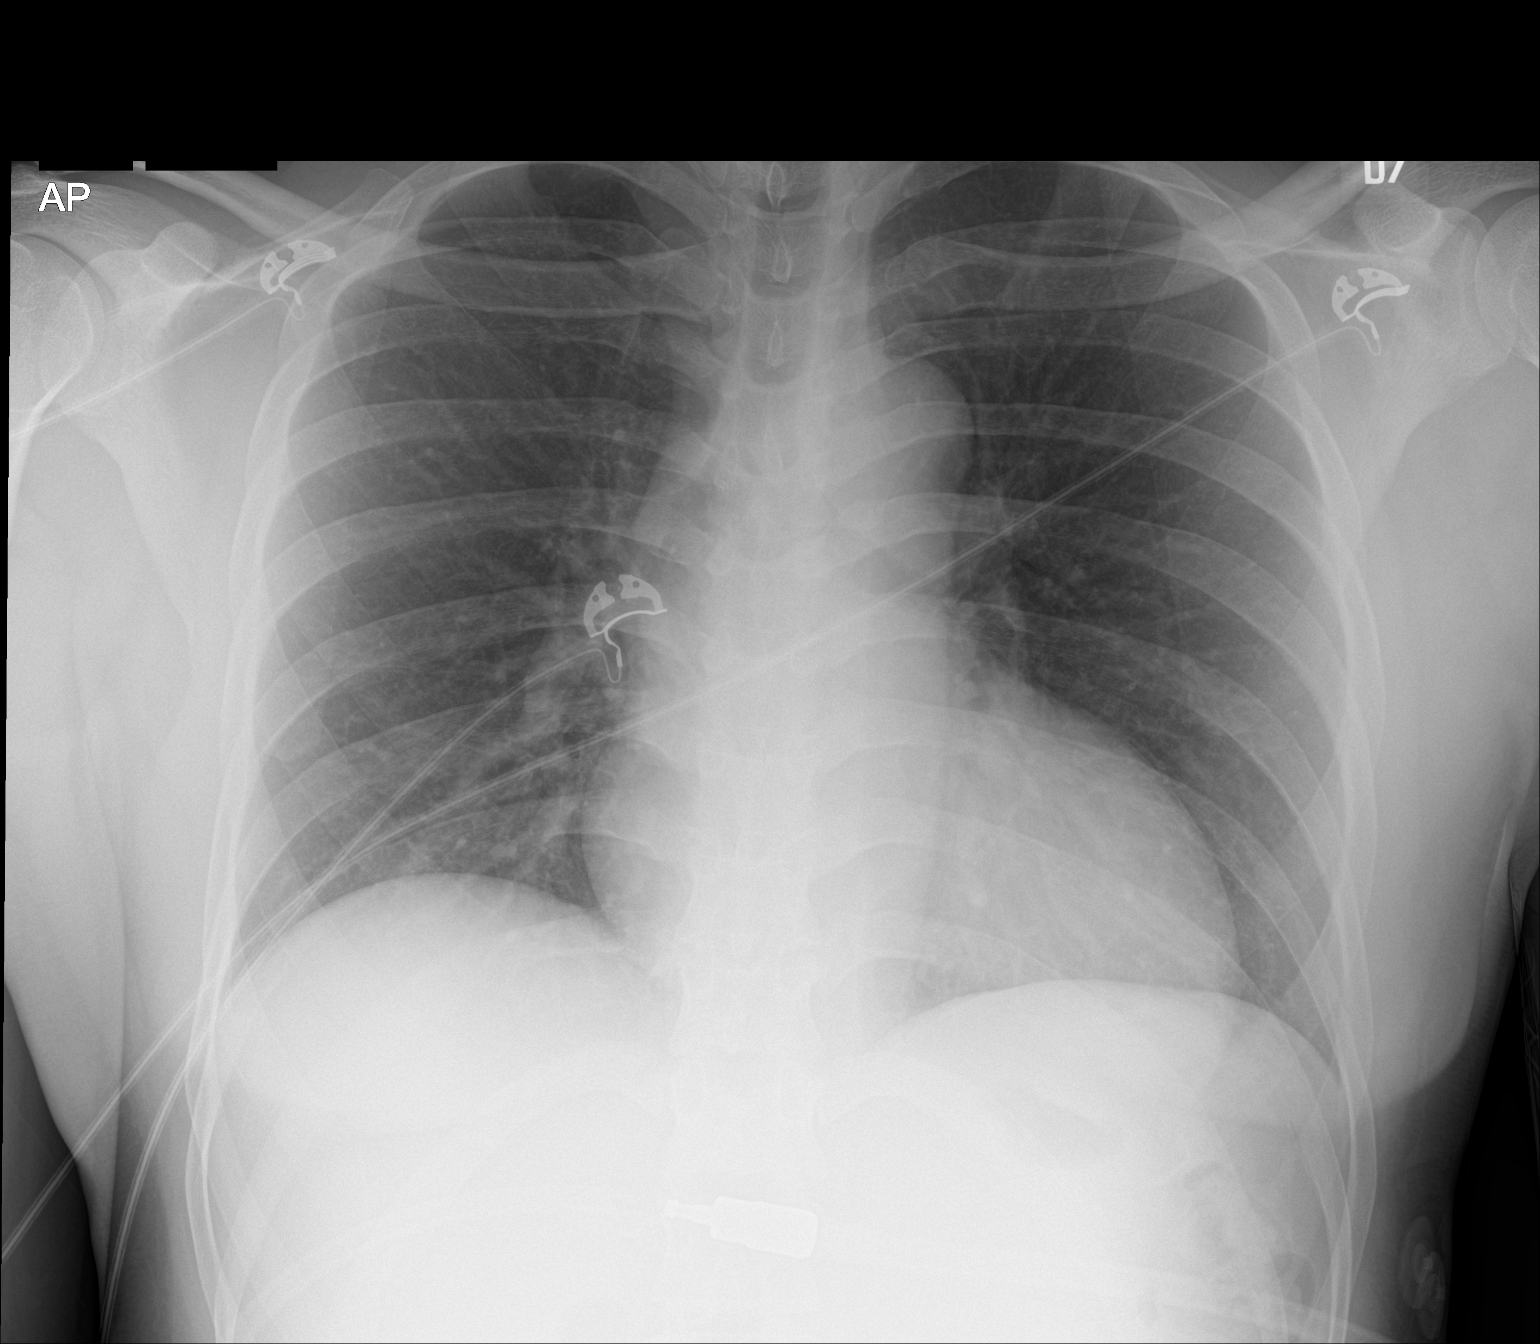

[1 of 1 positions shown; findings below may reference images not displayed]

FINDINGS: No consolidation, features of edema, pneumothorax, or effusion.
Pulmonary vascularity is normally distributed. The cardiomediastinal
contours are unremarkable. No acute osseous or soft tissue
abnormality. Telemetry leads overlie the chest.
IMPRESSION: No acute cardiopulmonary abnormality.

## 2023-11-29 ENCOUNTER — Encounter (HOSPITAL_BASED_OUTPATIENT_CLINIC_OR_DEPARTMENT_OTHER): Payer: Self-pay | Admitting: Emergency Medicine

## 2023-11-29 ENCOUNTER — Emergency Department (HOSPITAL_BASED_OUTPATIENT_CLINIC_OR_DEPARTMENT_OTHER)
Admission: EM | Admit: 2023-11-29 | Discharge: 2023-11-29 | Disposition: A | Discharge status: Home / Self Care | Attending: Emergency Medicine | Admitting: Emergency Medicine

## 2023-11-29 ENCOUNTER — Other Ambulatory Visit: Payer: Self-pay

## 2023-11-29 DIAGNOSIS — F17200 Nicotine dependence, unspecified, uncomplicated: Secondary | ICD-10-CM | POA: Insufficient documentation

## 2023-11-29 DIAGNOSIS — R059 Cough, unspecified: Secondary | ICD-10-CM | POA: Diagnosis present

## 2023-11-29 DIAGNOSIS — J22 Unspecified acute lower respiratory infection: Secondary | ICD-10-CM

## 2023-11-29 DIAGNOSIS — J069 Acute upper respiratory infection, unspecified: Secondary | ICD-10-CM | POA: Diagnosis not present

## 2023-11-29 MED ORDER — AMOXICILLIN-POT CLAVULANATE 875-125 MG PO TABS: 1.0000 | ORAL_TABLET | Freq: Two times a day (BID) | ORAL | 0 refills | Status: DC

## 2023-11-29 NOTE — ED Triage Notes (Addendum)
 Pt states sinus congestion, cough, chest soreness and difficulty breathing on and off. X 1 week

## 2023-11-29 NOTE — ED Provider Notes (Signed)
   [place] [facility]  Provider Note  CSN: [id number] Arrival date & time: 11/29/23 0554  History Chief Complaint  Patient presents with   URI    Trevor Gonzalez is a 38 y.o. male with history of tobacco use reports 7-10 days of productive cough, nasal congestion and sinus pressure. Some nausea this morning but no vomiting. No fever.    Home Medications Prior to Admission medications   Medication Sig Start Date End Date Taking? Authorizing Provider  amoxicillin -clavulanate (AUGMENTIN ) 875-125 MG tablet Take 1 tablet by mouth every 12 (twelve) hours. 11/29/23  Yes Roselyn Carlin NOVAK, MD     Allergies    Patient has no known allergies.   Review of Systems   Review of Systems Please see HPI for pertinent positives and negatives  Physical Exam BP (!) 138/99 (BP Location: Left Arm)   Pulse 74   Temp 98 F (36.7 C) (Oral)   Resp 20   Ht 5' 7 (1.702 m)   Wt 72.6 kg   SpO2 100%   BMI 25.06 kg/m   Physical Exam Vitals and nursing note reviewed.  Constitutional:      Appearance: Normal appearance.  HENT:     Head: Normocephalic and atraumatic.     Nose: Congestion present.     Mouth/Throat:     Mouth: Mucous membranes are moist.  Eyes:     Extraocular Movements: Extraocular movements intact.     Conjunctiva/sclera: Conjunctivae normal.  Cardiovascular:     Rate and Rhythm: Normal rate.  Pulmonary:     Effort: Pulmonary effort is normal.     Breath sounds: Normal breath sounds.  Abdominal:     General: Abdomen is flat.     Palpations: Abdomen is soft.     Tenderness: There is no abdominal tenderness.  Musculoskeletal:        General: No swelling. Normal range of motion.     Cervical back: Neck supple.  Skin:    General: Skin is warm and dry.  Neurological:     General: No focal deficit present.     Mental Status: He is alert.  Psychiatric:        Mood and Affect: Mood normal.     ED Results / Procedures / Treatments    EKG None  Procedures Procedures  Medications Ordered in the ED Medications - No data to display  Initial Impression and Plan  Patient here with URI symptoms, ongoing for more than a week. Likely viral in etiology but given duration of symptoms and tobacco use, a trial of Abx may be beneficial. Recommend OTC symptomatic medications, stop smoking. PCP follow up, RTED for any other concerns.    ED Course       MDM Rules/Calculators/A&P Medical Decision Making Problems Addressed: Lower respiratory tract infection: acute illness or injury  Risk Prescription drug management.     Final Clinical Impression(s) / ED Diagnoses Final diagnoses:  Lower respiratory tract infection    Rx / DC Orders ED Discharge Orders          Ordered    amoxicillin -clavulanate (AUGMENTIN ) 875-125 MG tablet  Every 12 hours        11/29/23 0613             Roselyn Carlin NOVAK, MD 11/29/23 [phone]

## 2024-04-05 ENCOUNTER — Other Ambulatory Visit: Payer: Self-pay

## 2024-04-05 ENCOUNTER — Encounter (HOSPITAL_BASED_OUTPATIENT_CLINIC_OR_DEPARTMENT_OTHER): Payer: Self-pay

## 2024-04-05 ENCOUNTER — Emergency Department (HOSPITAL_BASED_OUTPATIENT_CLINIC_OR_DEPARTMENT_OTHER)
Admission: EM | Admit: 2024-04-05 | Discharge: 2024-04-05 | Disposition: A | Discharge status: Home / Self Care | Attending: Emergency Medicine | Admitting: Emergency Medicine

## 2024-04-05 DIAGNOSIS — R519 Headache, unspecified: Secondary | ICD-10-CM | POA: Diagnosis not present

## 2024-04-05 DIAGNOSIS — J019 Acute sinusitis, unspecified: Secondary | ICD-10-CM

## 2024-04-05 DIAGNOSIS — R0981 Nasal congestion: Secondary | ICD-10-CM | POA: Diagnosis present

## 2024-04-05 DIAGNOSIS — J3489 Other specified disorders of nose and nasal sinuses: Secondary | ICD-10-CM | POA: Insufficient documentation

## 2024-04-05 LAB — RESP PANEL BY RT-PCR (RSV, FLU A&B, COVID)  RVPGX2
Influenza A by PCR: NEGATIVE
Influenza B by PCR: NEGATIVE
Resp Syncytial Virus by PCR: NEGATIVE
SARS Coronavirus 2 by RT PCR: NEGATIVE

## 2024-04-05 MED ORDER — AMOXICILLIN-POT CLAVULANATE 875-125 MG PO TABS: 1.0000 | ORAL_TABLET | Freq: Two times a day (BID) | ORAL | 0 refills | Status: AC

## 2024-04-05 NOTE — Discharge Instructions (Signed)
 Pick up the antibiotic that I sent in for you.  You may start taking that today.  Follow-up with your primary care if the symptoms continue.  If you have any worsening symptoms such as chest pain or shortness of breath please return to the ER.

## 2024-04-05 NOTE — ED Triage Notes (Signed)
 Pt reports that he thinks he has a sinus infection. States that he is congestion, headache, stuffy nose.

## 2024-04-05 NOTE — ED Provider Notes (Signed)
 " [place] [facility] Provider Note   CSN: [id number] Arrival date & time: 04/05/24  1234     Patient presents with: flu-like symptoms   Trevor Gonzalez is a 39 y.o. male.   HPI 39 year old male presenting with congestion.  Patient reports that this has been going on for about 2 weeks now.  His symptoms have not gotten any better.  He reports that he feels a lot of of sinus pressure in the front of his head.  He has also been having some headaches.  He has a history of sinus infections.  He denies any chest pain or shortness of breath.    Prior to Admission medications  Medication Sig Start Date End Date Taking? Authorizing Provider  amoxicillin -clavulanate (AUGMENTIN ) 875-125 MG tablet Take 1 tablet by mouth every 12 (twelve) hours. 11/29/23   Roselyn Carlin NOVAK, MD    Allergies: Patient has no known allergies.    Review of Systems  All other systems reviewed and are negative.   Updated Vital Signs BP (!) 138/100 (BP Location: Right Arm)   Pulse 69   Temp 98.3 F (36.8 C)   Resp 17   Ht 5' 7 (1.702 m)   Wt 72.6 kg   SpO2 98%   BMI 25.06 kg/m   Physical Exam Vitals and nursing note reviewed.  HENT:     Nose: Congestion and rhinorrhea present.     Right Sinus: No maxillary sinus tenderness or frontal sinus tenderness.     Left Sinus: No maxillary sinus tenderness or frontal sinus tenderness.     Mouth/Throat:     Pharynx: Oropharynx is clear.  Cardiovascular:     Rate and Rhythm: Normal rate.     Pulses: Normal pulses.  Pulmonary:     Effort: Pulmonary effort is normal.     Breath sounds: Normal breath sounds.  Skin:    General: Skin is warm and dry.  Neurological:     General: No focal deficit present.     Mental Status: He is alert.     (all labs ordered are listed, but only abnormal results are displayed) Labs Reviewed  RESP PANEL BY RT-PCR (RSV, FLU A&B, COVID)  RVPGX2    EKG: None  Radiology: No results  found.   Procedures   Medications Ordered in the ED - No data to display                                  Medical Decision Making  Impression: 39 year old male presenting with congestion.  Differential diagnosis include sinusitis, flu, COVID, viral URI  Additional History: Patient was able to provide history.  I also reviewed other outpatient notes  Labs: Respiratory panel was negative.  Imaging: None  ED Course/Meds: 39 year old male presenting with flulike symptoms.  He was well-appearing and in no acute distress.  Patient reports that the symptoms have been going on for about 2 weeks now.  They have not gotten any better.  He has tried over-the-counter medications such as Tylenol  and has not had any relief.  Patient reports that he has a history of sinus infections.  He also reports having a lot of sinus pressure and headaches.  He reports the last time he had a sinus infection felt like this.  Due to history and a negative respiratory panel most likely diagnosis is sinusitis.  I prescribed Augmentin  for this.  I  told him that he should follow-up with his primary care if he symptoms do not get any better.  Educated on signs and symptoms of when to return to the ER.  He verbally agreed to the plan.  Patient was stable while in the ER and at discharge.      Final diagnoses:  None    ED Discharge Orders     None          Rosaline Almarie KANDICE DEVONNA 04/05/24 1346    Dean Clarity, MD 04/05/24 1412  "

## 2024-04-05 NOTE — ED Notes (Signed)
 Patient transferred from waiting room to ED treatment room. Assuming pt care at this time.
